# Patient Record
Sex: Male | Born: 1935
Health system: Southern US, Community
[De-identification: ages and names within clinical notes are randomized; demographics above are authoritative.]

## PROBLEM LIST (undated history)

## (undated) DIAGNOSIS — H3530 Unspecified macular degeneration: Secondary | ICD-10-CM

---

## 2010-11-07 ENCOUNTER — Other Ambulatory Visit: Payer: Self-pay | Admitting: Unknown Physician Specialty

## 2010-12-19 ENCOUNTER — Ambulatory Visit: Payer: Self-pay | Admitting: Unknown Physician Specialty

## 2011-01-05 ENCOUNTER — Other Ambulatory Visit: Payer: Self-pay | Admitting: Unknown Physician Specialty

## 2011-04-05 ENCOUNTER — Ambulatory Visit: Payer: Self-pay | Admitting: Ophthalmology

## 2011-04-18 ENCOUNTER — Ambulatory Visit: Payer: Self-pay | Admitting: Ophthalmology

## 2011-06-04 ENCOUNTER — Ambulatory Visit: Payer: Self-pay | Admitting: Ophthalmology

## 2013-07-17 ENCOUNTER — Ambulatory Visit: Payer: Self-pay | Admitting: Internal Medicine

## 2014-09-14 DIAGNOSIS — E785 Hyperlipidemia, unspecified: Secondary | ICD-10-CM | POA: Diagnosis not present

## 2014-09-14 DIAGNOSIS — Z79899 Other long term (current) drug therapy: Secondary | ICD-10-CM | POA: Diagnosis not present

## 2014-09-14 DIAGNOSIS — Z139 Encounter for screening, unspecified: Secondary | ICD-10-CM | POA: Diagnosis not present

## 2014-09-21 DIAGNOSIS — Z87438 Personal history of other diseases of male genital organs: Secondary | ICD-10-CM | POA: Diagnosis not present

## 2014-09-21 DIAGNOSIS — M5136 Other intervertebral disc degeneration, lumbar region: Secondary | ICD-10-CM | POA: Diagnosis not present

## 2014-09-21 DIAGNOSIS — Z79899 Other long term (current) drug therapy: Secondary | ICD-10-CM | POA: Diagnosis not present

## 2014-09-21 DIAGNOSIS — N529 Male erectile dysfunction, unspecified: Secondary | ICD-10-CM | POA: Diagnosis not present

## 2015-04-05 DIAGNOSIS — R05 Cough: Secondary | ICD-10-CM | POA: Diagnosis not present

## 2015-04-05 DIAGNOSIS — Z0000 Encounter for general adult medical examination without abnormal findings: Secondary | ICD-10-CM | POA: Diagnosis not present

## 2015-04-05 DIAGNOSIS — M5136 Other intervertebral disc degeneration, lumbar region: Secondary | ICD-10-CM | POA: Diagnosis not present

## 2015-04-05 DIAGNOSIS — N529 Male erectile dysfunction, unspecified: Secondary | ICD-10-CM | POA: Diagnosis not present

## 2015-05-18 DIAGNOSIS — Z23 Encounter for immunization: Secondary | ICD-10-CM | POA: Diagnosis not present

## 2015-05-18 DIAGNOSIS — S41112S Laceration without foreign body of left upper arm, sequela: Secondary | ICD-10-CM | POA: Diagnosis not present

## 2015-05-26 DIAGNOSIS — L03114 Cellulitis of left upper limb: Secondary | ICD-10-CM | POA: Diagnosis not present

## 2015-05-31 ENCOUNTER — Encounter: Payer: Commercial Managed Care - HMO | Attending: Surgery | Admitting: Surgery

## 2015-05-31 DIAGNOSIS — S51812A Laceration without foreign body of left forearm, initial encounter: Secondary | ICD-10-CM | POA: Insufficient documentation

## 2015-05-31 DIAGNOSIS — X58XXXA Exposure to other specified factors, initial encounter: Secondary | ICD-10-CM | POA: Insufficient documentation

## 2015-05-31 DIAGNOSIS — Z87891 Personal history of nicotine dependence: Secondary | ICD-10-CM | POA: Diagnosis not present

## 2015-05-31 NOTE — Progress Notes (Signed)
[name], [name] ([id number]) Visit Report for 05/31/2015 Allergy List Details Patient Name: [name], [name]. Date of Service: 05/31/2015 1:00 PM Medical Record Number: [id number] Patient Account Number: [id number] Date of Birth/Sex: 02/11/1931 (81 y.o. Male) Treating RN: Baruch Gouty, RN, BSN, Velva Harman Primary Care Physician: Fulton Reek Other Clinician: Referring Physician: Fulton Reek Treating Physician/Extender: Frann Rider in Treatment: 0 Allergies Active Allergies Flexeril Novacaine Allergy Notes Electronic Signature(s) Signed: 05/31/2015 1:13:06 PM By: Regan Lemming BSN, RN Entered By: Regan Lemming on 05/31/2015 13:13:06 Graf, Earney Hamburg ([id number]) -------------------------------------------------------------------------------- Arrival Information Details Patient Name: [name]. Date of Service: 05/31/2015 1:00 PM Medical Record Number: [id number] Patient Account Number: [id number] Date of Birth/Sex: 1931/08/15 (81 y.o. Male) Treating RN: Baruch Gouty, RN, BSN, Velva Harman Primary Care Physician: Fulton Reek Other Clinician: Referring Physician: Fulton Reek Treating Physician/Extender: Frann Rider in Treatment: 0 Visit Information Patient Arrived: Ambulatory Arrival Time: 13:06 Accompanied By: self Transfer Assistance: None Patient Identification Verified: Yes Secondary Verification Process Yes Completed: Patient Requires Transmission-Based No Precautions: Patient Has Alerts: No Electronic Signature(s) Signed: 05/31/2015 1:07:13 PM By: Regan Lemming BSN, RN Entered By: Regan Lemming on 05/31/2015 Harper, Earney Hamburg ([id number]) -------------------------------------------------------------------------------- Clinic Level of Care Assessment Details Patient Name: [name]. Date of Service: 05/31/2015 1:00 PM Medical Record Number: [id number] Patient Account Number: [id number] Date of Birth/Sex: [birth year] (81 y.o. Male) Treating RN: Baruch Gouty, RN, BSN, Velva Harman Primary Care  Physician: Fulton Reek Other Clinician: Referring Physician: Fulton Reek Treating Physician/Extender: Frann Rider in Treatment: 0 Clinic Level of Care Assessment Items TOOL 1 Quantity Score []  - Use when EandM and Procedure is performed on INITIAL visit 0 ASSESSMENTS - Nursing Assessment / Reassessment X - General Physical Exam (combine w/ comprehensive assessment (listed just 1 20 below) when performed on new pt. evals) X - Comprehensive Assessment (HX, ROS, Risk Assessments, Wounds Hx, etc.) 1 25 ASSESSMENTS - Wound and Skin Assessment / Reassessment []  - Dermatologic / Skin Assessment (not related to wound area) 0 ASSESSMENTS - Ostomy and/or Continence Assessment and Care []  - Incontinence Assessment and Management 0 []  - Ostomy Care Assessment and Management (repouching, etc.) 0 PROCESS - Coordination of Care X - Simple Patient / Family Education for ongoing care 1 15 []  - Complex (extensive) Patient / Family Education for ongoing care 0 X - Staff obtains Programmer, systems, Records, Test Results / Process Orders 1 10 []  - Staff telephones HHA, Nursing Homes / Clarify orders / etc 0 []  - Routine Transfer to another Facility (non-emergent condition) 0 []  - Routine Hospital Admission (non-emergent condition) 0 X - New Admissions / Biomedical engineer / Ordering NPWT, Apligraf, etc. 1 15 []  - Emergency Hospital Admission (emergent condition) 0 PROCESS - Special Needs []  - Pediatric / Minor Patient Management 0 []  - Isolation Patient Management 0 Keatley, Landy K. ([id number]) []  - Hearing / Language / Visual special needs 0 []  - Assessment of Community assistance (transportation, D/C planning, etc.) 0 []  - Additional assistance / Altered mentation 0 []  - Support Surface(s) Assessment (bed, cushion, seat, etc.) 0 INTERVENTIONS - Miscellaneous []  - External ear exam 0 []  - Patient Transfer (multiple staff / Civil Service fast streamer / Similar devices) 0 []  - Simple Staple / Suture removal  (25 or less) 0 []  - Complex Staple / Suture removal (26 or more) 0 []  - Hypo/Hyperglycemic Management (do not check if billed separately) 0 []  - Ankle / Brachial Index (ABI) - do not check if billed separately 0 Has the patient been seen at the  hospital within the last three years: Yes Total Score: 85 Level Of Care: New/Established - Level 3 Electronic Signature(s) Signed: 05/31/2015 1:39:25 PM By: Regan Lemming BSN, RN Entered By: Regan Lemming on 05/31/2015 13:39:25 Sansom, Earney Hamburg ([id number]) -------------------------------------------------------------------------------- Encounter Discharge Information Details Patient Name: [name]. Date of Service: 05/31/2015 1:00 PM Medical Record Number: [id number] Patient Account Number: [id number] Date of Birth/Sex: Aug 09, 1931 (81 y.o. Male) Treating RN: Baruch Gouty, RN, BSN, Velva Harman Primary Care Physician: Fulton Reek Other Clinician: Referring Physician: Fulton Reek Treating Physician/Extender: Frann Rider in Treatment: 0 Encounter Discharge Information Items Discharge Pain Level: 0 Discharge Condition: Stable Ambulatory Status: Ambulatory Discharge Destination: Home Transportation: Private Auto Accompanied By: self Schedule Follow-up Appointment: No Medication Reconciliation completed and provided to Patient/Care No Provider: Provided on Clinical Summary of Care: 05/31/2015 Form Type Recipient Paper Patient JB Electronic Signature(s) Signed: 05/31/2015 1:44:56 PM By: Regan Lemming BSN, RN Previous Signature: 05/31/2015 1:44:25 PM Version By: Ruthine Dose Entered By: Regan Lemming on 05/31/2015 13:44:56 Holmer, Earney Hamburg ([id number]) -------------------------------------------------------------------------------- Lower Extremity Assessment Details Patient Name: [name]. Date of Service: 05/31/2015 1:00 PM Medical Record Number: [id number] Patient Account Number: [id number] Date of Birth/Sex: [birth year] (81 y.o. Male) Treating RN:  Baruch Gouty, RN, BSN, Velva Harman Primary Care Physician: Fulton Reek Other Clinician: Referring Physician: Fulton Reek Treating Physician/Extender: Frann Rider in Treatment: 0 Electronic Signature(s) Signed: 05/31/2015 1:13:23 PM By: Regan Lemming BSN, RN Entered By: Regan Lemming on 05/31/2015 13:13:23 Baka, Earney Hamburg ([id number]) -------------------------------------------------------------------------------- Multi Wound Chart Details Patient Name: [name]. Date of Service: 05/31/2015 1:00 PM Medical Record Number: [id number] Patient Account Number: [id number] Date of Birth/Sex: Jan 13, 1931 (81 y.o. Male) Treating RN: Baruch Gouty, RN, BSN, Velva Harman Primary Care Physician: Fulton Reek Other Clinician: Referring Physician: Fulton Reek Treating Physician/Extender: Frann Rider in Treatment: 0 Vital Signs Height(in): 69 Pulse(bpm): 87 Weight(lbs): 180 Blood Pressure 125/75 (mmHg): Body Mass Index(BMI): 27 Temperature(F): 98.3 Respiratory Rate 17 (breaths/min): Photos: [1:No Photos] [N/A:N/A] Wound Location: [1:Left Forearm - Anterior] [N/A:N/A] Wounding Event: [1:Gradually Appeared] [N/A:N/A] Primary Etiology: [1:Skin Tear] [N/A:N/A] Comorbid History: [1:Cataracts] [N/A:N/A] Date Acquired: [1:05/14/2015] [N/A:N/A] Weeks of Treatment: [1:0] [N/A:N/A] Wound Status: [1:Open] [N/A:N/A] Measurements L x W x D 1.7x1.9x0.1 [N/A:N/A] (cm) Area (cm) : [1:2.537] [N/A:N/A] Volume (cm) : [1:0.254] [N/A:N/A] % Reduction in Area: [1:0.00%] [N/A:N/A] % Reduction in Volume: 0.00% [N/A:N/A] Classification: [1:Unclassifiable] [N/A:N/A] Exudate Amount: [1:Small] [N/A:N/A] Exudate Type: [1:Serosanguineous] [N/A:N/A] Exudate Color: [1:red, brown] [N/A:N/A] Wound Margin: [1:Distinct, outline attached] [N/A:N/A] Granulation Amount: [1:None Present (0%)] [N/A:N/A] Necrotic Amount: [1:Large (67-100%)] [N/A:N/A] Necrotic Tissue: [1:Eschar] [N/A:N/A] Exposed Structures: [1:Fascia: No  Fat: No Tendon: No Muscle: No Joint: No Bone: No Limited to Skin Breakdown] [N/A:N/A] Epithelialization: None N/A N/A Periwound Skin Texture: Edema: No N/A N/A Excoriation: No Induration: No Callus: No Crepitus: No Fluctuance: No Friable: No Rash: No Scarring: No Periwound Skin Moist: Yes N/A N/A Moisture: Maceration: No Dry/Scaly: No Periwound Skin Color: Atrophie Blanche: No N/A N/A Cyanosis: No Ecchymosis: No Erythema: No Hemosiderin Staining: No Mottled: No Pallor: No Rubor: No Temperature: No Abnormality N/A N/A Tenderness on No N/A N/A Palpation: Wound Preparation: Ulcer Cleansing: N/A N/A Rinsed/Irrigated with Saline Topical Anesthetic Applied: Other: lidocaine 4 % Treatment Notes Electronic Signature(s) Signed: 05/31/2015 1:21:56 PM By: Regan Lemming BSN, RN Entered By: Regan Lemming on 05/31/2015 13:21:56 Cannell, Earney Hamburg ([id number]) -------------------------------------------------------------------------------- Multi-Disciplinary Care Plan Details Patient Name: [name]. Date of Service: 05/31/2015 1:00 PM Medical Record Number: [id number] Patient Account Number: [id number] Date of Birth/Sex: 1931-09-05 (81 y.o.  Male) Treating RN: Afful, RN, BSN, Velva Harman Primary Care Physician: Fulton Reek Other Clinician: Referring Physician: Fulton Reek Treating Physician/Extender: Frann Rider in Treatment: 0 Active Inactive Orientation to the Wound Care Program Nursing Diagnoses: Knowledge deficit related to the wound healing center program Goals: Patient/caregiver will verbalize understanding of the [facility] Program Date Initiated: 05/31/2015 Goal Status: Active Interventions: Provide education on orientation to the wound center Notes: Wound/Skin Impairment Nursing Diagnoses: Impaired tissue integrity Knowledge deficit related to ulceration/compromised skin integrity Goals: Patient/caregiver will verbalize understanding of skin care  regimen Date Initiated: 05/31/2015 Goal Status: Active Ulcer/skin breakdown will have a volume reduction of 30% by week 4 Date Initiated: 05/31/2015 Goal Status: Active Ulcer/skin breakdown will have a volume reduction of 50% by week 8 Date Initiated: 05/31/2015 Goal Status: Active Ulcer/skin breakdown will have a volume reduction of 80% by week 12 Date Initiated: 05/31/2015 Goal Status: Active Ulcer/skin breakdown will heal within 14 weeks Date Initiated: 05/31/2015 TRUXTON, STUPKA ([id number]) Goal Status: Active Interventions: Assess patient/caregiver ability to obtain necessary supplies Assess patient/caregiver ability to perform ulcer/skin care regimen upon admission and as needed Assess ulceration(s) every visit Provide education on ulcer and skin care Notes: Electronic Signature(s) Signed: 05/31/2015 1:21:43 PM By: Regan Lemming BSN, RN Entered By: Regan Lemming on 05/31/2015 13:21:43 Astorga, Earney Hamburg ([id number]) -------------------------------------------------------------------------------- Pain Assessment Details Patient Name: [name]. Date of Service: 05/31/2015 1:00 PM Medical Record Number: [id number] Patient Account Number: [id number] Date of Birth/Sex: [birth year] (81 y.o. Male) Treating RN: Baruch Gouty, RN, BSN, Velva Harman Primary Care Physician: Fulton Reek Other Clinician: Referring Physician: Fulton Reek Treating Physician/Extender: Frann Rider in Treatment: 0 Active Problems Location of Pain Severity and Description of Pain Patient Has Paino No Site Locations Pain Management and Medication Current Pain Management: Electronic Signature(s) Signed: 05/31/2015 1:07:19 PM By: Regan Lemming BSN, RN Entered By: Regan Lemming on 05/31/2015 13:07:19 Vetsch, Earney Hamburg ([id number]) -------------------------------------------------------------------------------- Patient/Caregiver Education Details Patient Name: [name]. Date of Service: 05/31/2015 1:00 PM Medical Record  Number: [id number] Patient Account Number: [id number] Date of Birth/Gender: 1930/09/19 (81 y.o. Male) Treating RN: Baruch Gouty, RN, BSN, Velva Harman Primary Care Physician: Fulton Reek Other Clinician: Referring Physician: Fulton Reek Treating Physician/Extender: Frann Rider in Treatment: 0 Education Assessment Education Provided To: Patient Education Topics Provided Basic Hygiene: Methods: Explain/Verbal Responses: State content correctly Welcome To [facility]: Methods: Explain/Verbal Responses: State content correctly Wound/Skin Impairment: Methods: Explain/Verbal Responses: State content correctly Electronic Signature(s) Signed: 05/31/2015 1:45:14 PM By: Regan Lemming BSN, RN Entered By: Regan Lemming on 05/31/2015 13:45:14 Zollinger, Earney Hamburg ([id number]) -------------------------------------------------------------------------------- Wound Assessment Details Patient Name: [name]. Date of Service: 05/31/2015 1:00 PM Medical Record Number: [id number] Patient Account Number: [id number] Date of Birth/Sex: November 12, 1930 (81 y.o. Male) Treating RN: Baruch Gouty, RN, BSN, [place] Primary Care Physician: Fulton Reek Other Clinician: Referring Physician: Fulton Reek Treating Physician/Extender: Frann Rider in Treatment: 0 Wound Status Wound Number: 1 Primary Etiology: Skin Tear Wound Location: Left Forearm - Anterior Wound Status: Open Wounding Event: Gradually Appeared Comorbid History: Cataracts Date Acquired: 05/14/2015 Weeks Of Treatment: 0 Clustered Wound: No Photos Photo Uploaded By: Regan Lemming on 05/31/2015 15:50:03 Wound Measurements Length: (cm) 1.7 Width: (cm) 1.9 Depth: (cm) 0.1 Area: (cm) 2.537 Volume: (cm) 0.254 % Reduction in Area: 0% % Reduction in Volume: 0% Epithelialization: None Tunneling: No Undermining: No Wound Description Classification: Unclassifiable Wound Margin: Distinct, outline attached Exudate Amount: Small Exudate  Type: Serosanguineous Exudate Color: red, brown Foul Odor After Cleansing: No  Wound Bed Granulation Amount: None Present (0%) Exposed Structure Necrotic Amount: Large (67-100%) Fascia Exposed: No Necrotic Quality: Eschar Fat Layer Exposed: No Tendon Exposed: No Storer, Kawan K. ([id number]) Muscle Exposed: No Joint Exposed: No Bone Exposed: No Limited to Skin Breakdown Periwound Skin Texture Texture Color No Abnormalities Noted: No No Abnormalities Noted: No Callus: No Atrophie Blanche: No Crepitus: No Cyanosis: No Excoriation: No Ecchymosis: No Fluctuance: No Erythema: No Friable: No Hemosiderin Staining: No Induration: No Mottled: No Localized Edema: No Pallor: No Rash: No Rubor: No Scarring: No Temperature / Pain Moisture Temperature: No Abnormality No Abnormalities Noted: No Dry / Scaly: No Maceration: No Moist: Yes Wound Preparation Ulcer Cleansing: Rinsed/Irrigated with Saline Topical Anesthetic Applied: Other: lidocaine 4 %, Treatment Notes Wound #1 (Left, Anterior Forearm) 1. Cleansed with: Clean wound with Normal Saline 4. Dressing Applied: Hydrafera Blue 5. Secondary Dressing Applied Bordered Foam Dressing Electronic Signature(s) Signed: 05/31/2015 1:16:34 PM By: Regan Lemming BSN, RN Entered By: Regan Lemming on 05/31/2015 13:16:34 Darden, Earney Hamburg ([id number]) -------------------------------------------------------------------------------- Vitals Details Patient Name: [name]. Date of Service: 05/31/2015 1:00 PM Medical Record Number: [id number] Patient Account Number: [id number] Date of Birth/Sex: 1930/10/20 (81 y.o. Male) Treating RN: Afful, RN, BSN, [place] Primary Care Physician: Fulton Reek Other Clinician: Referring Physician: Fulton Reek Treating Physician/Extender: Frann Rider in Treatment: 0 Vital Signs Time Taken: 13:18 Temperature (F): 98.3 Height (in): 69 Pulse (bpm): 87 Source: Stated Respiratory Rate  (breaths/min): 17 Weight (lbs): 180 Blood Pressure (mmHg): 125/75 Source: Stated Reference Range: 80 - 120 mg / dl Body Mass Index (BMI): 26.6 Electronic Signature(s) Signed: 05/31/2015 1:21:06 PM By: Regan Lemming BSN, RN Entered By: Regan Lemming on 05/31/2015 13:21:06

## 2015-05-31 NOTE — Progress Notes (Signed)
SUTTON, HIRSCH ([id number]) Visit Report for 05/31/2015 Abuse/Suicide Risk Screen Details Patient Name: [name], [name]. Date of Service: 05/31/2015 1:00 PM Medical Record Number: [id number] Patient Account Number: [id number] Date of Birth/Sex: Sep 24, 1930 (81 y.o. Male) Treating RN: Baruch Gouty, RN, BSN, Velva Harman Primary Care Physician: Fulton Reek Other Clinician: Referring Physician: Fulton Reek Treating Physician/Extender: Frann Rider in Treatment: 0 Abuse/Suicide Risk Screen Items Answer ABUSE/SUICIDE RISK SCREEN: Has anyone close to you tried to hurt or harm you recentlyo No Do you feel uncomfortable with anyone in your familyo No Has anyone forced you do things that you didnot want to doo No Do you have any thoughts of harming yourselfo No Patient displays signs or symptoms of abuse and/or neglect. No Electronic Signature(s) Signed: 05/31/2015 1:08:50 PM By: Regan Lemming BSN, RN Entered By: Regan Lemming on 05/31/2015 13:08:50 Eyman, Earney Hamburg ([id number]) -------------------------------------------------------------------------------- Activities of Daily Living Details Patient Name: [name], [name]. Date of Service: 05/31/2015 1:00 PM Medical Record Number: [id number] Patient Account Number: [id number] Date of Birth/Sex: [birth year] (81 y.o. Male) Treating RN: Baruch Gouty, RN, BSN, Velva Harman Primary Care Physician: Fulton Reek Other Clinician: Referring Physician: Fulton Reek Treating Physician/Extender: Frann Rider in Treatment: 0 Activities of Daily Living Items Answer Activities of Daily Living (Please select one for each item) Drive Automobile Completely Able Take Medications Completely Able Use Telephone Completely Able Care for Appearance Completely Able Use Toilet Completely Able Bath / Shower Completely Able Dress Self Completely Able Feed Self Completely Able Walk Completely Able Get In / Out Bed Completely Able Housework Completely Able Prepare Meals Completely  [place] for Self Completely Able Electronic Signature(s) Signed: 05/31/2015 1:08:14 PM By: Regan Lemming BSN, RN Entered By: Regan Lemming on 05/31/2015 [place], Earney Hamburg ([id number]) -------------------------------------------------------------------------------- Education Assessment Details Patient Name: [name]. Date of Service: 05/31/2015 1:00 PM Medical Record Number: [id number] Patient Account Number: [id number] Date of Birth/Sex: [birth year] (81 y.o. Male) Treating RN: Baruch Gouty, RN, BSN, Velva Harman Primary Care Physician: Fulton Reek Other Clinician: Referring Physician: Fulton Reek Treating Physician/Extender: Frann Rider in Treatment: 0 Primary Learner Assessed: Patient Learning Preferences/Education Level/Primary Language Learning Preference: Explanation Highest Education Level: College or Above Preferred Language: English Cognitive Barrier Assessment/Beliefs Language Barrier: No Physical Barrier Assessment Impaired Vision: Yes Glasses Impaired Hearing: No Decreased Hand dexterity: No Knowledge/Comprehension Assessment Knowledge Level: High Comprehension Level: High Ability to understand written High instructions: Ability to understand verbal High instructions: Motivation Assessment Anxiety Level: Calm Cooperation: Cooperative Education Importance: Acknowledges Need Interest in Health Problems: Asks Questions Perception: Coherent Willingness to Engage in Self- High Management Activities: Readiness to Engage in Self- High Management Activities: Electronic Signature(s) Signed: 05/31/2015 1:08:42 PM By: Regan Lemming BSN, RN Entered By: Regan Lemming on 05/31/2015 13:08:41 [place], Earney Hamburg ([id number]) -------------------------------------------------------------------------------- Fall Risk Assessment Details Patient Name: [name]. Date of Service: 05/31/2015 1:00 PM Medical Record Number: [id number] Patient Account  Number: [id number] Date of Birth/Sex: 01-26-1931 (81 y.o. Male) Treating RN: Baruch Gouty, RN, BSN, Velva Harman Primary Care Physician: Fulton Reek Other Clinician: Referring Physician: Fulton Reek Treating Physician/Extender: Frann Rider in Treatment: 0 Fall Risk Assessment Items FALL RISK ASSESSMENT: History of falling - immediate or within 3 months 0 No Secondary diagnosis 0 No Ambulatory aid None/bed rest/wheelchair/nurse 0 Yes Crutches/cane/walker 0 No Furniture 0 No IV Access/Saline Lock 0 No Gait/Training Normal/bed rest/immobile 0 Yes Weak 0 No Impaired 0 No Mental Status Oriented to own ability 0 Yes Electronic Signature(s) Signed: 05/31/2015 1:07:53 PM By: Regan Lemming  BSN, RN Entered By: Regan Lemming on 05/31/2015 13:07:53 Tvedt, Earney Hamburg ([id number]) -------------------------------------------------------------------------------- Foot Assessment Details Patient Name: [name], [name]. Date of Service: 05/31/2015 1:00 PM Medical Record Number: [id number] Patient Account Number: [id number] Date of Birth/Sex: 14-Jun-1931 (81 y.o. Male) Treating RN: Baruch Gouty, RN, BSN, Velva Harman Primary Care Physician: Fulton Reek Other Clinician: Referring Physician: Fulton Reek Treating Physician/Extender: Frann Rider in Treatment: 0 Foot Assessment Items Site Locations + = Sensation present, - = Sensation absent, C = Callus, U = Ulcer R = Redness, W = Warmth, M = Maceration, PU = Pre-ulcerative lesion F = Fissure, S = Swelling, D = Dryness Assessment Right: Left: Other Deformity: No No Prior Foot Ulcer: No No Prior Amputation: No No Charcot Joint: No No Ambulatory Status: Ambulatory Without Help Gait: Steady Electronic Signature(s) Signed: 05/31/2015 1:07:34 PM By: Regan Lemming BSN, RN Entered By: Regan Lemming on 05/31/2015 13:07:34 Bunt, Earney Hamburg ([id number]) -------------------------------------------------------------------------------- Nutrition Risk Assessment  Details Patient Name: [name]. Date of Service: 05/31/2015 1:00 PM Medical Record Number: [id number] Patient Account Number: [id number] Date of Birth/Sex: 09-29-1930 (81 y.o. Male) Treating RN: Baruch Gouty, RN, BSN, Velva Harman Primary Care Physician: Fulton Reek Other Clinician: Referring Physician: Fulton Reek Treating Physician/Extender: Frann Rider in Treatment: 0 Height (in): Weight (lbs): Body Mass Index (BMI): Nutrition Risk Assessment Items NUTRITION RISK SCREEN: I have an illness or condition that made me change the kind and/or 0 No amount of food I eat I eat fewer than two meals per day 0 No I eat few fruits and vegetables, or milk products 0 No I have three or more drinks of beer, liquor or wine almost every day 0 No I have tooth or mouth problems that make it hard for me to eat 0 No I don't always have enough money to buy the food I need 0 No I eat alone most of the time 0 No I take three or more different prescribed or over-the-counter drugs a 0 No day Without wanting to, I have lost or gained 10 pounds in the last six 0 No months I am not always physically able to shop, cook and/or feed myself 0 No Nutrition Protocols Good Risk Protocol 0 No interventions needed Moderate Risk Protocol Electronic Signature(s) Signed: 05/31/2015 1:07:41 PM By: Regan Lemming BSN, RN Entered By: Regan Lemming on 05/31/2015 13:07:41

## 2015-06-01 NOTE — Progress Notes (Signed)
TREYTON, SLIMP ([id number]) Visit Report for 05/31/2015 Chief Complaint Document Details Patient Name: [name], [name]. Date of Service: 05/31/2015 1:00 PM Medical Record Number: [id number] Patient Account Number: [id number] Date of Birth/Sex: 05/20/1931 (81 y.o. Male) Treating RN: Baruch Gouty, RN, BSN, Velva Harman Primary Care Physician: Fulton Reek Other Clinician: Referring Physician: Fulton Reek Treating Physician/Extender: Frann Rider in Treatment: 0 Information Obtained from: Patient Chief Complaint Patient presents to the wound care center for a consult due non healing wound. 81 year old gentleman who had a lacerated wound to his left forearm about 17 days ago. Electronic Signature(s) Signed: 05/31/2015 1:45:44 PM By: Christin Fudge MD, FACS Entered By: Christin Fudge on 05/31/2015 13:45:44 Marquina, [name] ([id number]) -------------------------------------------------------------------------------- Debridement Details Patient Name: [name]. Date of Service: 05/31/2015 1:00 PM Medical Record Number: [id number] Patient Account Number: [id number] Date of Birth/Sex: 1931-01-09 (81 y.o. Male) Treating RN: Baruch Gouty, RN, BSN, [place] Primary Care Physician: Fulton Reek Other Clinician: Referring Physician: Fulton Reek Treating Physician/Extender: Frann Rider in Treatment: 0 Debridement Performed for Wound #1 Left,Anterior Forearm Assessment: Performed By: Physician Pat Patrick., MD Debridement: Debridement Pre-procedure No Verification/Time Out Taken: Start Time: 13:35 Pain Control: Lidocaine 4% Topical Solution Level: Skin/Subcutaneous Tissue Total Area Debrided (L x 1.7 (cm) x 1.9 (cm) = 3.23 (cm) W): Tissue and other Viable, Non-Viable, Eschar, Exudate, Fibrin/Slough, Subcutaneous material debrided: Instrument: Forceps Bleeding: Minimum Hemostasis Achieved: Pressure End Time: 13:38 Procedural Pain: 0 Post Procedural Pain: 0 Response to Treatment:  Procedure was tolerated well Post Debridement Measurements of Total Wound Length: (cm) 1.7 Width: (cm) 1.9 Depth: (cm) 0.2 Volume: (cm) 0.507 Post Procedure Diagnosis Same as Pre-procedure Electronic Signature(s) Signed: 05/31/2015 1:45:16 PM By: Christin Fudge MD, FACS Signed: 05/31/2015 3:53:15 PM By: Regan Lemming BSN, RN Previous Signature: 05/31/2015 1:37:42 PM Version By: Regan Lemming BSN, RN Entered By: Christin Fudge on 05/31/2015 13:45:16 [place], [name] ([id number]) -------------------------------------------------------------------------------- HPI Details Patient Name: [name]. Date of Service: 05/31/2015 1:00 PM Medical Record Number: [id number] Patient Account Number: [id number] Date of Birth/Sex: 05-Jun-1931 (81 y.o. Male) Treating RN: Baruch Gouty, RN, BSN, Velva Harman Primary Care Physician: Fulton Reek Other Clinician: Referring Physician: Fulton Reek Treating Physician/Extender: Frann Rider in Treatment: 0 History of Present Illness Location: left forearm laceration Quality: Patient reports No Pain. Severity: Patient states wound are getting worse. Duration: Patient has had the wound for < 3 weeks prior to presenting for treatment Context: The wound occurred when the patient had a laceration at home against a bedpost Modifying Factors: Other treatment(s) tried include:various local medications plus Levaquin Associated Signs and Symptoms: Wound has not had a purlulent drainage HPI Description: spry 81 year old gentleman who had a lacerated wound to his left forearm 17 days ago. He tried various local remedies including anti-biotic ointment, over-the-counter solutions and was recently seen by his PCP who noted cellulitis and put him on Levaquin. His past medical history includes anemia, B12 deficiency, BPH, hemorrhoids. He quit smoking in 1966 Electronic Signature(s) Signed: 05/31/2015 1:48:48 PM By: Christin Fudge MD, FACS Entered By: Christin Fudge on 05/31/2015  13:48:48 [name], [name] ([id number]) -------------------------------------------------------------------------------- Physical Exam Details Patient Name: [name]. Date of Service: 05/31/2015 1:00 PM Medical Record Number: [id number] Patient Account Number: [id number] Date of Birth/Sex: [birth year] (81 y.o. Male) Treating RN: Baruch Gouty, RN, BSN, Velva Harman Primary Care Physician: Fulton Reek Other Clinician: Referring Physician: Fulton Reek Treating Physician/Extender: Frann Rider in Treatment: 0 Constitutional . Pulse regular. Respirations normal and unlabored. Afebrile. . Eyes Nonicteric. Reactive to light. Ears,  Nose, Mouth, and Throat Lips, teeth, and gums WNL.Marland Kitchen Moist mucosa without lesions . Neck supple and nontender. No palpable supraclavicular or cervical adenopathy. Normal sized without goiter. Respiratory WNL. No retractions.. Cardiovascular Pedal Pulses WNL. No clubbing, cyanosis or edema. Gastrointestinal (GI) Abdomen without masses or tenderness.. No liver or spleen enlargement or tenderness.. Lymphatic No adneopathy. No adenopathy. No adenopathy. Musculoskeletal Adexa without tenderness or enlargement.. Digits and nails w/o clubbing, cyanosis, infection, petechiae, ischemia, or inflammatory conditions.. Integumentary (Hair, Skin) No suspicious lesions. No crepitus or fluctuance. No peri-wound warmth or erythema. No masses.Marland Kitchen Psychiatric Judgement and insight Intact.. No evidence of depression, anxiety, or agitation.. Notes The wound on the left forearm was covered by thick eschar and contrast possibly from the dressing material is used. Below this there is a fairly dry wound with slough and discoloration due to the appointments he's been using. Sharp debridement was required to clean this. Electronic Signature(s) Signed: 05/31/2015 1:49:42 PM By: Christin Fudge MD, FACS Entered By: Christin Fudge on 05/31/2015 13:49:42 [name], [name]  ([id number]) -------------------------------------------------------------------------------- Physician Orders Details Patient Name: [name]. Date of Service: 05/31/2015 1:00 PM Medical Record Number: [id number] Patient Account Number: [id number] Date of Birth/Sex: 15-Jul-1931 (81 y.o. Male) Treating RN: Baruch Gouty, RN, BSN, Velva Harman Primary Care Physician: Fulton Reek Other Clinician: Referring Physician: Fulton Reek Treating Physician/Extender: Frann Rider in Treatment: 0 Verbal / Phone Orders: Yes Clinician: Afful, RN, BSN, Rita Read Back and Verified: Yes Diagnosis Coding Wound Cleansing Wound #1 Left,Anterior Forearm o Cleanse wound with mild soap and water Anesthetic Wound #1 Left,Anterior Forearm o Topical Lidocaine 4% cream applied to wound bed prior to debridement Skin Barriers/Peri-Wound Care Wound #1 Left,Anterior Forearm o Skin Prep Primary Wound Dressing Wound #1 Left,Anterior Forearm o Hydrafera Blue Secondary Dressing Wound #1 Left,Anterior Forearm o Boardered Foam Dressing Dressing Change Frequency Wound #1 Left,Anterior Forearm o Change dressing every other day. Follow-up Appointments Wound #1 Left,Anterior Forearm o Return Appointment in 1 week. Electronic Signature(s) Signed: 05/31/2015 1:38:52 PM By: Regan Lemming BSN, RN Signed: 05/31/2015 3:11:54 PM By: Christin Fudge MD, FACS Entered By: Regan Lemming on 05/31/2015 13:38:52 [name], [name] ([id number]) RUE, TINNEL ([id number]) -------------------------------------------------------------------------------- Problem List Details Patient Name: [name], [name]. Date of Service: 05/31/2015 1:00 PM Medical Record Number: [id number] Patient Account Number: [id number] Date of Birth/Sex: [birth year] (81 y.o. Male) Treating RN: Baruch Gouty, RN, BSN, Velva Harman Primary Care Physician: Fulton Reek Other Clinician: Referring Physician: Fulton Reek Treating Physician/Extender: Frann Rider in  Treatment: 0 Active Problems ICD-10 Encounter Code Description Active Date Diagnosis [phone] Laceration without foreign body of left forearm, initial 05/31/2015 Yes encounter Inactive Problems Resolved Problems Electronic Signature(s) Signed: 05/31/2015 1:44:49 PM By: Christin Fudge MD, FACS Entered By: Christin Fudge on 05/31/2015 13:44:49 [name], [name] ([id number]) -------------------------------------------------------------------------------- Progress Note Details Patient Name: [name]. Date of Service: 05/31/2015 1:00 PM Medical Record Number: [id number] Patient Account Number: [id number] Date of Birth/Sex: Apr 16, 1931 (81 y.o. Male) Treating RN: Baruch Gouty, RN, BSN, Velva Harman Primary Care Physician: Fulton Reek Other Clinician: Referring Physician: Fulton Reek Treating Physician/Extender: Frann Rider in Treatment: 0 Subjective Chief Complaint Information obtained from Patient Patient presents to the wound care center for a consult due non healing wound. 81 year old gentleman who had a lacerated wound to his left forearm about 17 days ago. History of Present Illness (HPI) The following HPI elements were documented for the patient's wound: Location: left forearm laceration Quality: Patient reports No Pain. Severity: Patient states wound are getting worse. Duration: Patient has  had the wound for < 3 weeks prior to presenting for treatment Context: The wound occurred when the patient had a laceration at home against a bedpost Modifying Factors: Other treatment(s) tried include:various local medications plus Levaquin Associated Signs and Symptoms: Wound has not had a purlulent drainage spry 81 year old gentleman who had a lacerated wound to his left forearm 17 days ago. He tried various local remedies including anti-biotic ointment, over-the-counter solutions and was recently seen by his PCP who noted cellulitis and put him on Levaquin. His past medical history includes  anemia, B12 deficiency, BPH, hemorrhoids. He quit smoking in 1966 Wound History Patient presents with 1 open wound that has been present for approximately 2weeks. Patient has been treating wound in the following manner: dry. Laboratory tests have not been performed in the last month. Patient reportedly has not tested positive for an antibiotic resistant organism. Patient reportedly has not tested positive for osteomyelitis. Patient reportedly has not had testing performed to evaluate circulation in the legs. Patient History Information obtained from Patient. Allergies Flexeril, Novacaine Family History Cancer - Father, Diabetes - Paternal Grandparents, Maternal Grandparents, No family history of Heart Disease, Hereditary Spherocytosis, Hypertension, Kidney Disease, Lung [name], Eric K. ([id number]) Disease, Seizures, Stroke, Thyroid Problems, Tuberculosis. Social History Former smoker - quit 40yeaers ago, Marital Status - Married, Alcohol Use - Never, Caffeine Use - Daily. Medical History Eyes Patient has history of Cataracts - removed [name]/Nose/Mouth/Throat Denies history of Chronic sinus problems/congestion, Middle [name] problems Hematologic/Lymphatic Denies history of Anemia, Hemophilia, Human Immunodeficiency Virus, Lymphedema, Sickle Cell Disease Respiratory Denies history of Aspiration, Asthma, Chronic Obstructive Pulmonary Disease (COPD), Pneumothorax, Sleep Apnea, Tuberculosis Cardiovascular Denies history of Angina, Arrhythmia, Congestive Heart Failure, Coronary Artery Disease, Deep Vein Thrombosis, Hypertension, Hypotension, Myocardial Infarction, Peripheral Arterial Disease, Peripheral Venous Disease, Phlebitis, Vasculitis Gastrointestinal Denies history of Cirrhosis , Colitis, Crohn s, Hepatitis A, Hepatitis B, Hepatitis C Endocrine Denies history of Type I Diabetes, Type II Diabetes Genitourinary Denies history of End Stage Renal Disease Immunological Denies history of  Lupus Erythematosus, Raynaud s, Scleroderma Integumentary (Skin) Denies history of History of Burn, History of pressure wounds Musculoskeletal Denies history of Gout, Rheumatoid Arthritis, Osteoarthritis, Osteomyelitis Neurologic Denies history of Dementia, Neuropathy, Quadriplegia, Paraplegia, Seizure Disorder Oncologic Denies history of Received Chemotherapy, Received Radiation Medical And Surgical History Notes Eyes Macular degenrrative disease Review of Systems (ROS) Eyes Complains or has symptoms of Glasses / Contacts. [name]/Nose/Mouth/Throat The patient has no complaints or symptoms. Hematologic/Lymphatic The patient has no complaints or symptoms. Respiratory The patient has no complaints or symptoms. Cardiovascular The patient has no complaints or symptoms. [name], [name] ([id number]) Gastrointestinal The patient has no complaints or symptoms. Endocrine The patient has no complaints or symptoms. Genitourinary The patient has no complaints or symptoms. Immunological The patient has no complaints or symptoms. Integumentary (Skin) Complains or has symptoms of Wounds. Musculoskeletal The patient has no complaints or symptoms. Neurologic The patient has no complaints or symptoms. Oncologic The patient has no complaints or symptoms. medication list: he takes calcium carbonate with vitamin D3, vitamin A, Flexeril. Objective Constitutional Pulse regular. Respirations normal and unlabored. Afebrile. Vitals Time Taken: 1:18 PM, Height: 69 in, Source: Stated, Weight: 180 lbs, Source: Stated, BMI: 26.6, Temperature: 98.3 F, Pulse: 87 bpm, Respiratory Rate: 17 breaths/min, Blood Pressure: 125/75 mmHg. Eyes Nonicteric. Reactive to light. Ears, Nose, Mouth, and Throat Lips, teeth, and gums WNL.Marland Kitchen Moist mucosa without lesions . Neck supple and nontender. No palpable supraclavicular or cervical adenopathy. Normal sized without goiter. Respiratory WNL.  No  retractions.. Cardiovascular Sehgal, Kelii K. ([id number]) Pedal Pulses WNL. No clubbing, cyanosis or edema. Gastrointestinal (GI) Abdomen without masses or tenderness.. No liver or spleen enlargement or tenderness.. Lymphatic No adneopathy. No adenopathy. No adenopathy. Musculoskeletal Adexa without tenderness or enlargement.. Digits and nails w/o clubbing, cyanosis, infection, petechiae, ischemia, or inflammatory conditions.Marland Kitchen Psychiatric Judgement and insight Intact.. No evidence of depression, anxiety, or agitation.. General Notes: The wound on the left forearm was covered by thick eschar and contrast possibly from the dressing material is used. Below this there is a fairly dry wound with slough and discoloration due to the appointments he's been using. Sharp debridement was required to clean this. Integumentary (Hair, Skin) No suspicious lesions. No crepitus or fluctuance. No peri-wound warmth or erythema. No masses.. Wound #1 status is Open. Original cause of wound was Gradually Appeared. The wound is located on the Left,Anterior Forearm. The wound measures 1.7cm length x 1.9cm width x 0.1cm depth; 2.537cm^2 area and 0.254cm^3 volume. The wound is limited to skin breakdown. There is no tunneling or undermining noted. There is a small amount of serosanguineous drainage noted. The wound margin is distinct with the outline attached to the wound base. There is no granulation within the wound bed. There is a large (67- 100%) amount of necrotic tissue within the wound bed including Eschar. The periwound skin appearance exhibited: Moist. The periwound skin appearance did not exhibit: Callus, Crepitus, Excoriation, Fluctuance, Friable, Induration, Localized Edema, Rash, Scarring, Dry/Scaly, Maceration, Atrophie Blanche, Cyanosis, Ecchymosis, Hemosiderin Staining, Mottled, Pallor, Rubor, Erythema. Periwound temperature was noted as No Abnormality. Assessment Active Problems ICD-10 Z32.992E -  Laceration without foreign body of left forearm, initial encounter Fonder, Ondra K. ([id number]) This lacerated wound of the left forearm has been treated with several abrasive chemicals and now has fairly dry wound base. After debriding and appropriately I have recommended Hydroferra blue and will use this on alternate days with a bordered foam dressing. Details of wound care have been discussed with him and he will come back and see as next week. Procedures Wound #1 Wound #1 is a Skin Tear located on the Left,Anterior Forearm . There was a Skin/Subcutaneous Tissue Debridement ([id number]) debridement with total area of 3.23 sq cm performed by Rini Moffit, Jackson Latino., MD. with the following instrument(s): Forceps to remove Viable and Non-Viable tissue/material including Exudate, Fibrin/Slough, Eschar, and Subcutaneous after achieving pain control using Lidocaine 4% Topical Solution. A time out was not conducted prior to the start of the procedure. A Minimum amount of bleeding was controlled with Pressure. The procedure was tolerated well with a pain level of 0 throughout and a pain level of 0 following the procedure. Post Debridement Measurements: 1.7cm length x 1.9cm width x 0.2cm depth; 0.507cm^3 volume. Post procedure Diagnosis Wound #1: Same as Pre-Procedure Plan Wound Cleansing: Wound #1 Left,Anterior Forearm: Cleanse wound with mild soap and water Anesthetic: Wound #1 Left,Anterior Forearm: Topical Lidocaine 4% cream applied to wound bed prior to debridement Skin Barriers/Peri-Wound Care: Wound #1 Left,Anterior Forearm: Skin Prep Primary Wound Dressing: Wound #1 Left,Anterior Forearm: Hydrafera Blue Secondary Dressing: Wound #1 Left,Anterior Forearm: Boardered Foam Dressing Dressing Change Frequency: Wound #1 Left,Anterior Forearm: Change dressing every other day. Follow-up Appointments: Wound #1 Left,Anterior Forearm: Return Appointment in 1 week. VASILIS, LUHMAN. ([id number]) This  lacerated wound of the left forearm has been treated with several abrasive chemicals and now has fairly dry wound base. After debriding and appropriately I have recommended Hydroferra blue and will use this on alternate days with  a bordered foam dressing. Details of wound care have been discussed with him and he will come back and see as next week. Electronic Signature(s) Signed: 05/31/2015 1:52:14 PM By: Christin Fudge MD, FACS Entered By: Christin Fudge on 05/31/2015 13:52:14 Peron, [name] ([id number]) -------------------------------------------------------------------------------- ROS/PFSH Details Patient Name: [name]. Date of Service: 05/31/2015 1:00 PM Medical Record Number: [id number] Patient Account Number: [id number] Date of Birth/Sex: [birth year] (81 y.o. Male) Treating RN: Baruch Gouty, RN, BSN, [place] Primary Care Physician: Fulton Reek Other Clinician: Referring Physician: Fulton Reek Treating Physician/Extender: Frann Rider in Treatment: 0 Information Obtained From Patient Wound History Do you currently have one or more open woundso Yes How many open wounds do you currently haveo 1 Approximately how long have you had your woundso 2weeks How have you been treating your wound(s) until nowo dry Has your wound(s) ever healed and then re-openedo No Have you had any lab work done in the past montho No Have you tested positive for an antibiotic resistant organism (MRSA, VRE)o No Have you tested positive for osteomyelitis (bone infection)o No Have you had any tests for circulation on your legso No Eyes Complaints and Symptoms: Positive for: Glasses / Contacts Medical History: Positive for: Cataracts - removed Past Medical History Notes: Macular degenrrative disease Integumentary (Skin) Complaints and Symptoms: Positive for: Wounds Medical History: Negative for: History of Burn; History of pressure wounds [name]/Nose/Mouth/Throat Complaints and Symptoms: No  Complaints or Symptoms Medical History: Negative for: Chronic sinus problems/congestion; Middle [name] problems Hematologic/Lymphatic [name], CLERK. ([id number]) Complaints and Symptoms: No Complaints or Symptoms Medical History: Negative for: Anemia; Hemophilia; Human Immunodeficiency Virus; Lymphedema; Sickle Cell Disease Respiratory Complaints and Symptoms: No Complaints or Symptoms Medical History: Negative for: Aspiration; Asthma; Chronic Obstructive Pulmonary Disease (COPD); Pneumothorax; Sleep Apnea; Tuberculosis Cardiovascular Complaints and Symptoms: No Complaints or Symptoms Medical History: Negative for: Angina; Arrhythmia; Congestive Heart Failure; Coronary Artery Disease; Deep Vein Thrombosis; Hypertension; Hypotension; Myocardial Infarction; Peripheral Arterial Disease; Peripheral Venous Disease; Phlebitis; Vasculitis Gastrointestinal Complaints and Symptoms: No Complaints or Symptoms Medical History: Negative for: Cirrhosis ; Colitis; Crohnos; Hepatitis A; Hepatitis B; Hepatitis C Endocrine Complaints and Symptoms: No Complaints or Symptoms Medical History: Negative for: Type I Diabetes; Type II Diabetes Genitourinary Complaints and Symptoms: No Complaints or Symptoms Medical History: Negative for: End Stage Renal Disease Immunological [name], Eric K. ([id number]) Complaints and Symptoms: No Complaints or Symptoms Medical History: Negative for: Lupus Erythematosus; Raynaudos; Scleroderma Musculoskeletal Complaints and Symptoms: No Complaints or Symptoms Medical History: Negative for: Gout; Rheumatoid Arthritis; Osteoarthritis; Osteomyelitis Neurologic Complaints and Symptoms: No Complaints or Symptoms Medical History: Negative for: Dementia; Neuropathy; Quadriplegia; Paraplegia; Seizure Disorder Oncologic Complaints and Symptoms: No Complaints or Symptoms Medical History: Negative for: Received Chemotherapy; Received Radiation HBO Extended History  Items Eyes: Cataracts Family and Social History Cancer: Yes - Father; Diabetes: Yes - Paternal Grandparents, Maternal Grandparents; Heart Disease: No; Hereditary Spherocytosis: No; Hypertension: No; Kidney Disease: No; Lung Disease: No; Seizures: No; Stroke: No; Thyroid Problems: No; Tuberculosis: No; Former smoker - quit 40yeaers ago; Marital Status - Married; Alcohol Use: Never; Caffeine Use: Daily; Financial Concerns: No; Food, Clothing or Shelter Needs: No; Support System Lacking: No; Transportation Concerns: No; Advanced Directives: No; Patient does not want information on Advanced Directives; Living Will: No Physician Affirmation I have reviewed and agree with the above information. Electronic Signature(s) Signed: 05/31/2015 1:49:59 PM By: Christin Fudge MD, FACS Signed: 05/31/2015 3:53:15 PM By: Regan Lemming BSN, RN Previous Signature: 05/31/2015 1:12:24 PM Version By: Regan Lemming BSN, RN  Previous Signature: 05/31/2015 1:13:39 PM Version By: Christin Fudge MD, FACS [name], [name] ([id number]) Entered By: Christin Fudge on 05/31/2015 13:49:59 [name], [name] ([id number]) -------------------------------------------------------------------------------- SuperBill Details Patient Name: [name]. Date of Service: 05/31/2015 Medical Record Number: [id number] Patient Account Number: [id number] Date of Birth/Sex: January 06, 1931 (81 y.o. Male) Treating RN: Baruch Gouty, RN, BSN, Velva Harman Primary Care Physician: Fulton Reek Other Clinician: Referring Physician: Fulton Reek Treating Physician/Extender: Frann Rider in Treatment: 0 Diagnosis Coding ICD-10 Codes Code Description [phone] Laceration without foreign body of left forearm, initial encounter Facility Procedures CPT4 Code Description: [id number] [place] VISIT-LEV 3 EST PT Modifier: Quantity: 1 CPT4 Code Description: [id number] 15041 - DEB SUBQ TISSUE 20 SQ CM/< ICD-10 Description Diagnosis S51.812A Laceration without foreign  body of left forearm, init Modifier: ial encounte Quantity: 1 r Physician Procedures CPT4 Code Description: [id number] 79396 - WC PHYS LEVEL 4 - NEW PT ICD-10 Description Diagnosis S51.812A Laceration without foreign body of left forearm, init Modifier: ial encounter Quantity: 1 CPT4 Code Description: [id number] 11042 - WC PHYS SUBQ TISS 20 SQ CM ICD-10 Description Diagnosis S51.812A Laceration without foreign body of left forearm, init Modifier: ial encounter Quantity: 1 Electronic Signature(s) Signed: 05/31/2015 1:52:42 PM By: Christin Fudge MD, FACS Entered By: Christin Fudge on 05/31/2015 13:52:42

## 2015-06-07 ENCOUNTER — Encounter: Payer: Commercial Managed Care - HMO | Admitting: Surgery

## 2015-06-07 DIAGNOSIS — Z87891 Personal history of nicotine dependence: Secondary | ICD-10-CM | POA: Diagnosis not present

## 2015-06-07 DIAGNOSIS — S51812A Laceration without foreign body of left forearm, initial encounter: Secondary | ICD-10-CM | POA: Diagnosis not present

## 2015-06-08 NOTE — Progress Notes (Signed)
[name] ([id number]) Visit Report for 06/07/2015 Arrival Information Details Patient Name: [name], [name]. Date of Service: 06/07/2015 2:15 PM Medical Record Number: [id number] Patient Account Number: [id number] Date of Birth/Sex: 07-04-1931 (81 y.o. Male) Treating RN: Cornell Barman Primary Care Physician: Fulton Reek Other Clinician: Referring Physician: Fulton Reek Treating Physician/Extender: Frann Rider in Treatment: 1 Visit Information History Since Last Visit Added or deleted any medications: No Patient Arrived: Ambulatory Any new allergies or adverse reactions: No Arrival Time: 14:31 Had a fall or experienced change in No Accompanied By: self activities of daily living that may affect Transfer Assistance: None risk of falls: Patient Identification Verified: Yes Signs or symptoms of abuse/neglect since last No Secondary Verification Process Yes visito Completed: Hospitalized since last visit: No Patient Requires Transmission-Based No Has Dressing in Place as Prescribed: Yes Precautions: Pain Present Now: No Patient Has Alerts: No Electronic Signature(s) Signed: 06/07/2015 5:26:34 PM By: Gretta Cool, RN, BSN, Kim RN, BSN Entered By: Gretta Cool, RN, BSN, Kim on 06/07/2015 14:32:12 [place], Earney Hamburg ([id number]) -------------------------------------------------------------------------------- Encounter Discharge Information Details Patient Name: [name]. Date of Service: 06/07/2015 2:15 PM Medical Record Number: [id number] Patient Account Number: [id number] Date of Birth/Sex: 1931-02-02 (81 y.o. Male) Treating RN: Montey Hora Primary Care Physician: Fulton Reek Other Clinician: Referring Physician: Fulton Reek Treating Physician/Extender: Frann Rider in Treatment: 1 Encounter Discharge Information Items Discharge Pain Level: 0 Discharge Condition: Stable Ambulatory Status: Ambulatory Discharge Destination: Home Transportation: Private  Auto Accompanied By: self Schedule Follow-up Appointment: Yes Medication Reconciliation completed and provided to Patient/Care Yes Provider: Provided on Clinical Summary of Care: 06/07/2015 Form Type Recipient Paper Patient JB Electronic Signature(s) Signed: 06/07/2015 5:26:34 PM By: Gretta Cool RN, BSN, Kim RN, BSN Previous Signature: 06/07/2015 2:44:52 PM Version By: Ruthine Dose Entered By: Gretta Cool RN, BSN, Kim on 06/07/2015 14:47:11 [place], Earney Hamburg ([id number]) -------------------------------------------------------------------------------- Multi Wound Chart Details Patient Name: [name]. Date of Service: 06/07/2015 2:15 PM Medical Record Number: [id number] Patient Account Number: [id number] Date of Birth/Sex: 03-06-1931 (81 y.o. Male) Treating RN: Cornell Barman Primary Care Physician: Fulton Reek Other Clinician: Referring Physician: Fulton Reek Treating Physician/Extender: Frann Rider in Treatment: 1 Vital Signs Height(in): 69 Pulse(bpm): 80 Weight(lbs): 180 Blood Pressure 126/79 (mmHg): Body Mass Index(BMI): 27 Temperature(F): 98.3 Respiratory Rate 18 (breaths/min): Photos: [1:No Photos] [N/A:N/A] Wound Location: [1:Left Forearm - Anterior] [N/A:N/A] Wounding Event: [1:Gradually Appeared] [N/A:N/A] Primary Etiology: [1:Skin Tear] [N/A:N/A] Comorbid History: [1:Cataracts] [N/A:N/A] Date Acquired: [1:05/14/2015] [N/A:N/A] Weeks of Treatment: [1:1] [N/A:N/A] Wound Status: [1:Open] [N/A:N/A] Measurements L x W x D 1.4x1.2x0.1 [N/A:N/A] (cm) Area (cm) : [1:1.319] [N/A:N/A] Volume (cm) : [1:0.132] [N/A:N/A] % Reduction in Area: [1:48.00%] [N/A:N/A] % Reduction in Volume: 48.00% [N/A:N/A] Classification: [1:Full Thickness Without Exposed Support Structures] [N/A:N/A] Exudate Amount: [1:Small] [N/A:N/A] Exudate Type: [1:Serosanguineous] [N/A:N/A] Exudate Color: [1:red, brown] [N/A:N/A] Wound Margin: [1:Distinct, outline attached] [N/A:N/A] Granulation  Amount: [1:Small (1-33%)] [N/A:N/A] Granulation Quality: [1:Pink] [N/A:N/A] Necrotic Amount: [1:Large (67-100%)] [N/A:N/A] Necrotic Tissue: [1:Eschar] [N/A:N/A] Exposed Structures: [1:Fascia: No Fat: No Tendon: No Muscle: No Joint: No] [N/A:N/A] Bone: No Limited to Skin Breakdown Epithelialization: None N/A N/A Periwound Skin Texture: Scarring: Yes N/A N/A Edema: No Excoriation: No Induration: No Callus: No Crepitus: No Fluctuance: No Friable: No Rash: No Periwound Skin Moist: Yes N/A N/A Moisture: Maceration: No Dry/Scaly: No Periwound Skin Color: Atrophie Blanche: No N/A N/A Cyanosis: No Ecchymosis: No Erythema: No Hemosiderin Staining: No Mottled: No Pallor: No Rubor: No Temperature: No Abnormality N/A N/A Tenderness on No N/A N/A Palpation: Wound  Preparation: Ulcer Cleansing: N/A N/A Rinsed/Irrigated with Saline Topical Anesthetic Applied: Other: lidocaine 4 % Treatment Notes Electronic Signature(s) Signed: 06/07/2015 5:26:34 PM By: Gretta Cool, RN, BSN, Kim RN, BSN Entered By: Gretta Cool, RN, BSN, Kim on 06/07/2015 14:36:44 [place], Earney Hamburg ([id number]) -------------------------------------------------------------------------------- Multi-Disciplinary Care Plan Details Patient Name: [name]. Date of Service: 06/07/2015 2:15 PM Medical Record Number: [id number] Patient Account Number: [id number] Date of Birth/Sex: 05-Sep-1931 (81 y.o. Male) Treating RN: Cornell Barman Primary Care Physician: Fulton Reek Other Clinician: Referring Physician: Fulton Reek Treating Physician/Extender: Frann Rider in Treatment: 1 Active Inactive Orientation to the Wound Care Program Nursing Diagnoses: Knowledge deficit related to the wound healing center program Goals: Patient/caregiver will verbalize understanding of the [facility] Date Initiated: 05/31/2015 Goal Status: Active Interventions: Provide education on orientation to the wound  center Notes: Wound/Skin Impairment Nursing Diagnoses: Impaired tissue integrity Knowledge deficit related to ulceration/compromised skin integrity Goals: Patient/caregiver will verbalize understanding of skin care regimen Date Initiated: 05/31/2015 Goal Status: Active Ulcer/skin breakdown will have a volume reduction of 30% by week 4 Date Initiated: 05/31/2015 Goal Status: Active Ulcer/skin breakdown will have a volume reduction of 50% by week 8 Date Initiated: 05/31/2015 Goal Status: Active Ulcer/skin breakdown will have a volume reduction of 80% by week 12 Date Initiated: 05/31/2015 Goal Status: Active Ulcer/skin breakdown will heal within 14 weeks Date Initiated: 05/31/2015 LOTUS, GOVER ([id number]) Goal Status: Active Interventions: Assess patient/caregiver ability to obtain necessary supplies Assess patient/caregiver ability to perform ulcer/skin care regimen upon admission and as needed Assess ulceration(s) every visit Provide education on ulcer and skin care Notes: Electronic Signature(s) Signed: 06/07/2015 5:26:34 PM By: Gretta Cool, RN, BSN, Kim RN, BSN Entered By: Gretta Cool, RN, BSN, Kim on 06/07/2015 14:36:36 [place], Earney Hamburg ([id number]) -------------------------------------------------------------------------------- Patient/Caregiver Education Details Patient Name: [name]. Date of Service: 06/07/2015 2:15 PM Medical Record Number: [id number] Patient Account Number: [id number] Date of Birth/Gender: 16-Mar-1931 (81 y.o. Male) Treating RN: Cornell Barman Primary Care Physician: Fulton Reek Other Clinician: Referring Physician: Fulton Reek Treating Physician/Extender: Frann Rider in Treatment: 1 Education Assessment Education Provided To: Patient Education Topics Provided Wound/Skin Impairment: Handouts: Caring for Your Ulcer, Other: continue wound care as presscribed Electronic Signature(s) Signed: 06/07/2015 5:26:34 PM By: Gretta Cool, RN, BSN, Kim RN, BSN Entered  By: Gretta Cool, RN, BSN, Kim on 06/07/2015 14:47:38 [place], Earney Hamburg ([id number]) -------------------------------------------------------------------------------- Wound Assessment Details Patient Name: [name]. Date of Service: 06/07/2015 2:15 PM Medical Record Number: [id number] Patient Account Number: [id number] Date of Birth/Sex: 10-Jun-1931 (81 y.o. Male) Treating RN: Cornell Barman Primary Care Physician: Fulton Reek Other Clinician: Referring Physician: Fulton Reek Treating Physician/Extender: Frann Rider in Treatment: 1 Wound Status Wound Number: 1 Primary Etiology: Skin Tear Wound Location: Left Forearm - Anterior Wound Status: Open Wounding Event: Gradually Appeared Comorbid History: Cataracts Date Acquired: 05/14/2015 Weeks Of Treatment: 1 Clustered Wound: No Photos Photo Uploaded By: Gretta Cool, RN, BSN, Kim on 06/07/2015 17:01:59 Wound Measurements Length: (cm) 1.4 Width: (cm) 1.2 Depth: (cm) 0.1 Area: (cm) 1.319 Volume: (cm) 0.132 % Reduction in Area: 48% % Reduction in Volume: 48% Epithelialization: None Tunneling: No Undermining: No Wound Description Full Thickness Without Exposed Classification: Support Structures Wound Margin: Distinct, outline attached Exudate Small Amount: Exudate Type: Serosanguineous Exudate Color: red, brown Foul Odor After Cleansing: No Wound Bed Granulation Amount: Small (1-33%) Exposed Structure Granulation Quality: Pink Fascia Exposed: No Necrotic Amount: Large (67-100%) Fat Layer Exposed: No Bukhari, Ammiel K. ([id number]) Necrotic Quality: Eschar Tendon Exposed: No Muscle Exposed:  No Joint Exposed: No Bone Exposed: No Limited to Skin Breakdown Periwound Skin Texture Texture Color No Abnormalities Noted: No No Abnormalities Noted: No Callus: No Atrophie Blanche: No Crepitus: No Cyanosis: No Excoriation: No Ecchymosis: No Fluctuance: No Erythema: No Friable: No Hemosiderin Staining: No Induration:  No Mottled: No Localized Edema: No Pallor: No Rash: No Rubor: No Scarring: Yes Temperature / Pain Moisture Temperature: No Abnormality No Abnormalities Noted: No Dry / Scaly: No Maceration: No Moist: Yes Wound Preparation Ulcer Cleansing: Rinsed/Irrigated with Saline Topical Anesthetic Applied: Other: lidocaine 4 %, Treatment Notes Wound #1 (Left, Anterior Forearm) 1. Cleansed with: Clean wound with Normal Saline 2. Anesthetic Topical Lidocaine 4% cream to wound bed prior to debridement 4. Dressing Applied: Hydrafera Blue 5. Secondary Dressing Applied Bordered Foam Dressing Electronic Signature(s) Signed: 06/07/2015 5:26:34 PM By: Gretta Cool, RN, BSN, Kim RN, BSN Entered By: Gretta Cool, RN, BSN, Kim on 06/07/2015 14:34:52 [place], Earney Hamburg ([id number]) -------------------------------------------------------------------------------- Vitals Details Patient Name: [name]. Date of Service: 06/07/2015 2:15 PM Medical Record Number: [id number] Patient Account Number: [id number] Date of Birth/Sex: 1931/01/19 (81 y.o. Male) Treating RN: Cornell Barman Primary Care Physician: Fulton Reek Other Clinician: Referring Physician: Fulton Reek Treating Physician/Extender: Frann Rider in Treatment: 1 Vital Signs Time Taken: 14:32 Temperature (F): 98.3 Height (in): 69 Pulse (bpm): 80 Weight (lbs): 180 Respiratory Rate (breaths/min): 18 Body Mass Index (BMI): 26.6 Blood Pressure (mmHg): 126/79 Reference Range: 80 - 120 mg / dl Electronic Signature(s) Signed: 06/07/2015 5:26:34 PM By: Gretta Cool, RN, BSN, Kim RN, BSN Entered By: Gretta Cool, RN, BSN, Kim on 06/07/2015 14:33:26

## 2015-06-08 NOTE — Progress Notes (Signed)
Eric, [name] ([id number]) Visit Report for 06/07/2015 Chief Complaint Document Details Patient Name: [name], [name]. Date of Service: 06/07/2015 2:15 PM Medical Record Number: [id number] Patient Account Number: [id number] Date of Birth/Sex: 12/11/1930 (81 y.o. Male) Treating RN: Montey Hora Primary Care Physician: Fulton Reek Other Clinician: Referring Physician: Fulton Reek Treating Physician/Extender: Frann Rider in Treatment: 1 Information Obtained from: Patient Chief Complaint Patient presents to the wound care center for a consult due non healing wound. 81 year old gentleman who had a lacerated wound to his left forearm about 17 days ago. Electronic Signature(s) Signed: 06/07/2015 2:51:12 PM By: Christin Fudge MD, FACS Entered By: Christin Fudge on 06/07/2015 Payne, Earney Hamburg ([id number]) -------------------------------------------------------------------------------- Debridement Details Patient Name: [name]. Date of Service: 06/07/2015 2:15 PM Medical Record Number: [id number] Patient Account Number: [id number] Date of Birth/Sex: 1931-05-13 (81 y.o. Male) Treating RN: Montey Hora Primary Care Physician: Fulton Reek Other Clinician: Referring Physician: Fulton Reek Treating Physician/Extender: Frann Rider in Treatment: 1 Debridement Performed for Wound #1 Left,Anterior Forearm Assessment: Performed By: Physician Pat Patrick., MD Debridement: Debridement Pre-procedure Yes Verification/Time Out Taken: Start Time: 14:40 Pain Control: Other : lidocaine 4% Level: Skin/Subcutaneous Tissue Total Area Debrided (L x 1.4 (cm) x 1.2 (cm) = 1.68 (cm) W): Tissue and other Viable, Non-Viable, Exudate, Fibrin/Slough, Subcutaneous material debrided: Instrument: Curette Bleeding: Minimum Hemostasis Achieved: Pressure End Time: 14:45 Procedural Pain: 0 Post Procedural Pain: 0 Response to Treatment: Procedure was tolerated well Post  Debridement Measurements of Total Wound Length: (cm) 1.4 Width: (cm) 1.2 Depth: (cm) 0.1 Volume: (cm) 0.132 Post Procedure Diagnosis Same as Pre-procedure Electronic Signature(s) Signed: 06/07/2015 2:51:05 PM By: Christin Fudge MD, FACS Signed: 06/07/2015 4:21:27 PM By: Montey Hora Entered By: Christin Fudge on 06/07/2015 14:51:05 Bendix, Earney Hamburg ([id number]) -------------------------------------------------------------------------------- HPI Details Patient Name: [name]. Date of Service: 06/07/2015 2:15 PM Medical Record Number: [id number] Patient Account Number: [id number] Date of Birth/Sex: 04-Dec-1930 (80 y.o. Male) Treating RN: Montey Hora Primary Care Physician: Fulton Reek Other Clinician: Referring Physician: Fulton Reek Treating Physician/Extender: Frann Rider in Treatment: 1 History of Present Illness Location: left forearm laceration Quality: Patient reports No Pain. Severity: Patient states wound are getting worse. Duration: Patient has had the wound for < 3 weeks prior to presenting for treatment Context: The wound occurred when the patient had a laceration at home against a bedpost Modifying Factors: Other treatment(s) tried include:various local medications plus Levaquin Associated Signs and Symptoms: Wound has not had a purlulent drainage HPI Description: spry 81 year old gentleman who had a lacerated wound to his left forearm 17 days ago. He tried various local remedies including anti-biotic ointment, over-the-counter solutions and was recently seen by his PCP who noted cellulitis and put him on Levaquin. His past medical history includes anemia, B12 deficiency, BPH, hemorrhoids. He quit smoking in 1966 Electronic Signature(s) Signed: 06/07/2015 2:51:17 PM By: Christin Fudge MD, FACS Entered By: Christin Fudge on 06/07/2015 14:51:17 Miler, Earney Hamburg  ([id number]) -------------------------------------------------------------------------------- Physical Exam Details Patient Name: [name]. Date of Service: 06/07/2015 2:15 PM Medical Record Number: [id number] Patient Account Number: [id number] Date of Birth/Sex: Mar 16, 1931 (81 y.o. Male) Treating RN: Montey Hora Primary Care Physician: Fulton Reek Other Clinician: Referring Physician: Fulton Reek Treating Physician/Extender: Frann Rider in Treatment: 1 Constitutional . Pulse regular. Respirations normal and unlabored. Afebrile. . Eyes Nonicteric. Reactive to light. Ears, Nose, Mouth, and Throat Lips, teeth, and gums WNL.Marland Kitchen Moist mucosa without lesions . Neck supple and nontender. No palpable supraclavicular or  cervical adenopathy. Normal sized without goiter. Respiratory WNL. No retractions.. Cardiovascular Pedal Pulses WNL. No clubbing, cyanosis or edema. Chest Breasts symmetical and no nipple discharge.. Breast tissue WNL, no masses, lumps, or tenderness.. Lymphatic No adneopathy. No adenopathy. No adenopathy. Musculoskeletal Adexa without tenderness or enlargement.. Digits and nails w/o clubbing, cyanosis, infection, petechiae, ischemia, or inflammatory conditions.. Integumentary (Hair, Skin) No suspicious lesions. No crepitus or fluctuance. No peri-wound warmth or erythema. No masses.Marland Kitchen Psychiatric Judgement and insight Intact.. No evidence of depression, anxiety, or agitation.. Notes He still has some amount of eschar though it is looking much better than last week. I have used a curetted sharply debrided down to healthy subcutaneous tissue. Electronic Signature(s) Signed: 06/07/2015 2:52:23 PM By: Christin Fudge MD, FACS Entered By: Christin Fudge on 06/07/2015 14:52:22 Soderberg, Earney Hamburg ([id number]) -------------------------------------------------------------------------------- Physician Orders Details Patient Name: [name]. Date of Service:  06/07/2015 2:15 PM Medical Record Number: [id number] Patient Account Number: [id number] Date of Birth/Sex: [birth year] (81 y.o. Male) Treating RN: Cornell Barman Primary Care Physician: Fulton Reek Other Clinician: Referring Physician: Fulton Reek Treating Physician/Extender: Frann Rider in Treatment: 1 Verbal / Phone Orders: Yes Clinician: Cornell Barman Read Back and Verified: Yes Diagnosis Coding Wound Cleansing Wound #1 Left,Anterior Forearm o Cleanse wound with mild soap and water Anesthetic Wound #1 Left,Anterior Forearm o Topical Lidocaine 4% cream applied to wound bed prior to debridement Skin Barriers/Peri-Wound Care Wound #1 Left,Anterior Forearm o Skin Prep Primary Wound Dressing Wound #1 Left,Anterior Forearm o Hydrafera Blue Secondary Dressing Wound #1 Left,Anterior Forearm o Boardered Foam Dressing Dressing Change Frequency Wound #1 Left,Anterior Forearm o Change dressing every other day. Follow-up Appointments Wound #1 Left,Anterior Forearm o Return Appointment in 1 week. Electronic Signature(s) Signed: 06/07/2015 3:53:46 PM By: Christin Fudge MD, FACS Signed: 06/07/2015 5:26:34 PM By: Gretta Cool RN, BSN, Kim RN, BSN Entered By: Gretta Cool, RN, BSN, Kim on 06/07/2015 14:46:23 Proehl, GARRIT MARROW ([id number], LACROIX ([id number]) -------------------------------------------------------------------------------- Problem List Details Patient Name: BENJAMINE, STROUT. Date of Service: 06/07/2015 2:15 PM Medical Record Number: [id number] Patient Account Number: [id number] Date of Birth/Sex: May 02, 1931 (81 y.o. Male) Treating RN: Montey Hora Primary Care Physician: Fulton Reek Other Clinician: Referring Physician: Fulton Reek Treating Physician/Extender: Frann Rider in Treatment: 1 Active Problems ICD-10 Encounter Code Description Active Date Diagnosis [phone] Laceration without foreign body of left forearm, initial 05/31/2015  Yes encounter Inactive Problems Resolved Problems Electronic Signature(s) Signed: 06/07/2015 2:50:56 PM By: Christin Fudge MD, FACS Entered By: Christin Fudge on 06/07/2015 14:50:55 Spargo, Earney Hamburg ([id number]) -------------------------------------------------------------------------------- Progress Note Details Patient Name: [name]. Date of Service: 06/07/2015 2:15 PM Medical Record Number: [id number] Patient Account Number: [id number] Date of Birth/Sex: [birth year] (81 y.o. Male) Treating RN: Montey Hora Primary Care Physician: Fulton Reek Other Clinician: Referring Physician: Fulton Reek Treating Physician/Extender: Frann Rider in Treatment: 1 Subjective Chief Complaint Information obtained from Patient Patient presents to the wound care center for a consult due non healing wound. 81 year old gentleman who had a lacerated wound to his left forearm about 17 days ago. History of Present Illness (HPI) The following HPI elements were documented for the patient's wound: Location: left forearm laceration Quality: Patient reports No Pain. Severity: Patient states wound are getting worse. Duration: Patient has had the wound for < 3 weeks prior to presenting for treatment Context: The wound occurred when the patient had a laceration at home against a bedpost Modifying Factors: Other treatment(s) tried include:various local medications plus Levaquin Associated Signs and Symptoms: Wound has  not had a purlulent drainage spry 81 year old gentleman who had a lacerated wound to his left forearm 17 days ago. He tried various local remedies including anti-biotic ointment, over-the-counter solutions and was recently seen by his PCP who noted cellulitis and put him on Levaquin. His past medical history includes anemia, B12 deficiency, BPH, hemorrhoids. He quit smoking in 1966 Objective Constitutional Pulse regular. Respirations normal and unlabored. Afebrile. Vitals Time  Taken: 2:32 PM, Height: 69 in, Weight: 180 lbs, BMI: 26.6, Temperature: 98.3 F, Pulse: 80 bpm, Respiratory Rate: 18 breaths/min, Blood Pressure: 126/79 mmHg. Eyes Nonicteric. Reactive to light. Ears, Nose, Mouth, and Throat Clyne, Kermit K. ([id number]) Lips, teeth, and gums WNL.Marland Kitchen Moist mucosa without lesions . Neck supple and nontender. No palpable supraclavicular or cervical adenopathy. Normal sized without goiter. Respiratory WNL. No retractions.. Cardiovascular Pedal Pulses WNL. No clubbing, cyanosis or edema. Chest Breasts symmetical and no nipple discharge.. Breast tissue WNL, no masses, lumps, or tenderness.. Lymphatic No adneopathy. No adenopathy. No adenopathy. Musculoskeletal Adexa without tenderness or enlargement.. Digits and nails w/o clubbing, cyanosis, infection, petechiae, ischemia, or inflammatory conditions.Marland Kitchen Psychiatric Judgement and insight Intact.. No evidence of depression, anxiety, or agitation.. General Notes: He still has some amount of eschar though it is looking much better than last week. I have used a curetted sharply debrided down to healthy subcutaneous tissue. Integumentary (Hair, Skin) No suspicious lesions. No crepitus or fluctuance. No peri-wound warmth or erythema. No masses.. Wound #1 status is Open. Original cause of wound was Gradually Appeared. The wound is located on the Left,Anterior Forearm. The wound measures 1.4cm length x 1.2cm width x 0.1cm depth; 1.319cm^2 area and 0.132cm^3 volume. The wound is limited to skin breakdown. There is no tunneling or undermining noted. There is a small amount of serosanguineous drainage noted. The wound margin is distinct with the outline attached to the wound base. There is small (1-33%) pink granulation within the wound bed. There is a large (67-100%) amount of necrotic tissue within the wound bed including Eschar. The periwound skin appearance exhibited: Scarring, Moist. The periwound skin appearance did  not exhibit: Callus, Crepitus, Excoriation, Fluctuance, Friable, Induration, Localized Edema, Rash, Dry/Scaly, Maceration, Atrophie Blanche, Cyanosis, Ecchymosis, Hemosiderin Staining, Mottled, Pallor, Rubor, Erythema. Periwound temperature was noted as No Abnormality. Assessment Active Problems ICD-10 DEVONTRE, SIEDSCHLAG ([id number]) [phone] - Laceration without foreign body of left forearm, initial encounter Procedures Wound #1 Wound #1 is a Skin Tear located on the Left,Anterior Forearm . There was a Skin/Subcutaneous Tissue Debridement ([id number]) debridement with total area of 1.68 sq cm performed by Chesley Valls, Jackson Latino., MD. with the following instrument(s): Curette to remove Viable and Non-Viable tissue/material including Exudate, Fibrin/Slough, and Subcutaneous after achieving pain control using Other (lidocaine 4%). A time out was conducted prior to the start of the procedure. A Minimum amount of bleeding was controlled with Pressure. The procedure was tolerated well with a pain level of 0 throughout and a pain level of 0 following the procedure. Post Debridement Measurements: 1.4cm length x 1.2cm width x 0.1cm depth; 0.132cm^3 volume. Post procedure Diagnosis Wound #1: Same as Pre-Procedure Plan Wound Cleansing: Wound #1 Left,Anterior Forearm: Cleanse wound with mild soap and water Anesthetic: Wound #1 Left,Anterior Forearm: Topical Lidocaine 4% cream applied to wound bed prior to debridement Skin Barriers/Peri-Wound Care: Wound #1 Left,Anterior Forearm: Skin Prep Primary Wound Dressing: Wound #1 Left,Anterior Forearm: Hydrafera Blue Secondary Dressing: Wound #1 Left,Anterior Forearm: Boardered Foam Dressing Dressing Change Frequency: Wound #1 Left,Anterior Forearm: Change dressing every other day. Follow-up Appointments:  Wound #1 Left,Anterior Forearm: Return Appointment in 1 week. ROSALIO, CATTERTON. ([id number]) I have recommended we continue with Hydrofera Blue and bordered  foam dressing and he is changing it every day because he gets it wet in the shower. He will come back and see me next week. Electronic Signature(s) Signed: 06/07/2015 2:52:54 PM By: Christin Fudge MD, FACS Entered By: Christin Fudge on 06/07/2015 14:52:54 Sorenson, Earney Hamburg ([id number]) -------------------------------------------------------------------------------- SuperBill Details Patient Name: [name]. Date of Service: 06/07/2015 Medical Record Number: [id number] Patient Account Number: [id number] Date of Birth/Sex: 09-20-1930 (81 y.o. Male) Treating RN: Montey Hora Primary Care Physician: Fulton Reek Other Clinician: Referring Physician: Fulton Reek Treating Physician/Extender: Frann Rider in Treatment: 1 Diagnosis Coding ICD-10 Codes Code Description [phone] Laceration without foreign body of left forearm, initial encounter Facility Procedures CPT4 Code Description: [id number] [place] - DEB SUBQ TISSUE 20 SQ CM/< ICD-10 Description Diagnosis S51.812A Laceration without foreign body of left forearm, init Modifier: ial encounte Quantity: 1 r Physician Procedures CPT4 Code Description: [id number] 11042 - WC PHYS SUBQ TISS 20 SQ CM ICD-10 Description Diagnosis S51.812A Laceration without foreign body of left forearm, init Modifier: ial encounter Quantity: 1 Electronic Signature(s) Signed: 06/07/2015 2:53:03 PM By: Christin Fudge MD, FACS Entered By: Christin Fudge on 06/07/2015 14:53:03

## 2015-06-14 ENCOUNTER — Encounter: Payer: Commercial Managed Care - HMO | Attending: Surgery | Admitting: Surgery

## 2015-06-14 DIAGNOSIS — S51812A Laceration without foreign body of left forearm, initial encounter: Secondary | ICD-10-CM | POA: Diagnosis not present

## 2015-06-14 DIAGNOSIS — X58XXXA Exposure to other specified factors, initial encounter: Secondary | ICD-10-CM | POA: Insufficient documentation

## 2015-06-15 NOTE — Progress Notes (Signed)
[name] ([id number]) Visit Report for 06/14/2015 Chief Complaint Document Details Patient Name: [name], [name]. Date of Service: 06/14/2015 1:00 PM Medical Record Number: [id number] Patient Account Number: [id number] Date of Birth/Sex: 1930-11-05 (81 y.o. Male) Treating RN: Cornell Barman Primary Care Physician: Fulton Reek Other Clinician: Referring Physician: Fulton Reek Treating Physician/Extender: Frann Rider in Treatment: 2 Information Obtained from: Patient Chief Complaint Patient presents to the wound care center for a consult due non healing wound. 81 year old gentleman who had a lacerated wound to his left forearm about 17 days ago. Electronic Signature(s) Signed: 06/14/2015 1:40:32 PM By: Christin Fudge MD, FACS Entered By: Christin Fudge on 06/14/2015 13:40:31 [name] ([id number]) -------------------------------------------------------------------------------- HPI Details Patient Name: [name]. Date of Service: 06/14/2015 1:00 PM Medical Record Number: [id number] Patient Account Number: [id number] Date of Birth/Sex: 02/08/1931 (81 y.o. Male) Treating RN: Cornell Barman Primary Care Physician: Fulton Reek Other Clinician: Referring Physician: Fulton Reek Treating Physician/Extender: Frann Rider in Treatment: 2 History of Present Illness Location: left forearm laceration Quality: Patient reports No Pain. Severity: Patient states wound are getting worse. Duration: Patient has had the wound for < 3 weeks prior to presenting for treatment Context: The wound occurred when the patient had a laceration at home against a bedpost Modifying Factors: Other treatment(s) tried include:various local medications plus Levaquin Associated Signs and Symptoms: Wound has not had a purlulent drainage HPI Description: spry 81 year old gentleman who had a lacerated wound to his left forearm 17 days ago. He tried various local remedies including anti-biotic  ointment, over-the-counter solutions and was recently seen by his PCP who noted cellulitis and put him on Levaquin. His past medical history includes anemia, B12 deficiency, BPH, hemorrhoids. He quit smoking in 1966 06/14/2015 -- he has developed some keratosis next to his wound and was concerned about that. Electronic Signature(s) Signed: 06/14/2015 1:41:04 PM By: Christin Fudge MD, FACS Entered By: Christin Fudge on 06/14/2015 13:41:04 [name] -------------------------------------------------------------------------------- Physical Exam Details Patient Name: [name]. Date of Service: 06/14/2015 1:00 PM Medical Record Number: [id number] Patient Account Number: [id number] Date of Birth/Sex: 1931-07-04 (81 y.o. Male) Treating RN: Cornell Barman Primary Care Physician: Fulton Reek Other Clinician: Referring Physician: Fulton Reek Treating Physician/Extender: Frann Rider in Treatment: 2 Constitutional . Pulse regular. Respirations normal and unlabored. Afebrile. . Eyes Nonicteric. Reactive to light. Ears, Nose, Mouth, and Throat Lips, teeth, and gums WNL.Marland Kitchen Moist mucosa without lesions . Neck supple and nontender. No palpable supraclavicular or cervical adenopathy. Normal sized without goiter. Respiratory WNL. No retractions.. Cardiovascular Pedal Pulses WNL. No clubbing, cyanosis or edema. Lymphatic No adneopathy. No adenopathy. No adenopathy. Musculoskeletal Adexa without tenderness or enlargement.. Digits and nails w/o clubbing, cyanosis, infection, petechiae, ischemia, or inflammatory conditions.. Integumentary (Hair, Skin) No suspicious lesions. No crepitus or fluctuance. No peri-wound warmth or erythema. No masses.Marland Kitchen Psychiatric Judgement and insight Intact.. No evidence of depression, anxiety, or agitation.. Notes Wound base is clean and it is much smaller than last week. He has got some keratosis at the edge of the wound and I have asked him  to leave this alone. Electronic Signature(s) Signed: 06/14/2015 1:41:50 PM By: Christin Fudge MD, FACS Entered By: Christin Fudge on 06/14/2015 13:41:50 [name] -------------------------------------------------------------------------------- Physician Orders Details Patient Name: [name]. Date of Service: 06/14/2015 1:00 PM Medical Record Number: [id number] Patient Account Number: [id number] Date of Birth/Sex: Sep 08, 1931 (81 y.o. Male) Treating RN: Cornell Barman Primary Care Physician: Fulton Reek Other Clinician: Referring Physician:  Fulton Reek Treating Physician/Extender: Frann Rider in Treatment: 2 Verbal / Phone Orders: Yes Clinician: Cornell Barman Read Back and Verified: Yes Diagnosis Coding Wound Cleansing Wound #1 Left,Anterior Forearm o Cleanse wound with mild soap and water Anesthetic Wound #1 Left,Anterior Forearm o Topical Lidocaine 4% cream applied to wound bed prior to debridement Skin Barriers/Peri-Wound Care Wound #1 Left,Anterior Forearm o Skin Prep Primary Wound Dressing Wound #1 Left,Anterior Forearm o Prisma Ag Secondary Dressing Wound #1 Left,Anterior Forearm o Boardered Foam Dressing Dressing Change Frequency Wound #1 Left,Anterior Forearm o Change dressing every other day. Follow-up Appointments Wound #1 Left,Anterior Forearm o Return Appointment in 1 week. Electronic Signature(s) Signed: 06/14/2015 5:06:09 PM By: Christin Fudge MD, FACS Signed: 06/14/2015 5:33:23 PM By: Gretta Cool RN, BSN, Kim RN, BSN Entered By: Gretta Cool, RN, BSN, Kim on 06/14/2015 13:20:50 [name] ([name], [name] ([id number]) -------------------------------------------------------------------------------- Problem List Details Patient Name: [name], MONDAY. Date of Service: 06/14/2015 1:00 PM Medical Record Number: [id number] Patient Account Number: [id number] Date of Birth/Sex: 1931-07-08 (81 y.o. Male) Treating RN: Cornell Barman Primary Care Physician: Fulton Reek Other Clinician: Referring Physician: Fulton Reek Treating Physician/Extender: Frann Rider in Treatment: 2 Active Problems ICD-10 Encounter Code Description Active Date Diagnosis [phone] Laceration without foreign body of left forearm, initial 05/31/2015 Yes encounter Inactive Problems Resolved Problems Electronic Signature(s) Signed: 06/14/2015 1:40:25 PM By: Christin Fudge MD, FACS Entered By: Christin Fudge on 06/14/2015 13:40:25 Rietz, [name] ([id number]) -------------------------------------------------------------------------------- Progress Note Details Patient Name: [name]. Date of Service: 06/14/2015 1:00 PM Medical Record Number: [id number] Patient Account Number: [id number] Date of Birth/Sex: 04/14/1931 (81 y.o. Male) Treating RN: Cornell Barman Primary Care Physician: Fulton Reek Other Clinician: Referring Physician: Fulton Reek Treating Physician/Extender: Frann Rider in Treatment: 2 Subjective Chief Complaint Information obtained from Patient Patient presents to the wound care center for a consult due non healing wound. 81 year old gentleman who had a lacerated wound to his left forearm about 17 days ago. History of Present Illness (HPI) The following HPI elements were documented for the patient's wound: Location: left forearm laceration Quality: Patient reports No Pain. Severity: Patient states wound are getting worse. Duration: Patient has had the wound for < 3 weeks prior to presenting for treatment Context: The wound occurred when the patient had a laceration at home against a bedpost Modifying Factors: Other treatment(s) tried include:various local medications plus Levaquin Associated Signs and Symptoms: Wound has not had a purlulent drainage spry 81 year old gentleman who had a lacerated wound to his left forearm 17 days ago. He tried various local remedies including anti-biotic  ointment, over-the-counter solutions and was recently seen by his PCP who noted cellulitis and put him on Levaquin. His past medical history includes anemia, B12 deficiency, BPH, hemorrhoids. He quit smoking in 1966 06/14/2015 -- he has developed some keratosis next to his wound and was concerned about that. Objective Constitutional Pulse regular. Respirations normal and unlabored. Afebrile. Vitals Time Taken: 1:07 PM, Height: 69 in, Weight: 180 lbs, BMI: 26.6, Temperature: 98.2 F, Pulse: 88 bpm, Respiratory Rate: 16 breaths/min, Blood Pressure: 136/67 mmHg. Eyes Nonicteric. Reactive to light. Galer, Kamani K. ([id number]) Ears, Nose, Mouth, and Throat Lips, teeth, and gums WNL.Marland Kitchen Moist mucosa without lesions . Neck supple and nontender. No palpable supraclavicular or cervical adenopathy. Normal sized without goiter. Respiratory WNL. No retractions.. Cardiovascular Pedal Pulses WNL. No clubbing, cyanosis or edema. Lymphatic No adneopathy. No adenopathy. No adenopathy. Musculoskeletal Adexa without tenderness or enlargement.. Digits and nails w/o clubbing, cyanosis,  infection, petechiae, ischemia, or inflammatory conditions.Marland Kitchen Psychiatric Judgement and insight Intact.. No evidence of depression, anxiety, or agitation.. General Notes: Wound base is clean and it is much smaller than last week. He has got some keratosis at the edge of the wound and I have asked him to leave this alone. Integumentary (Hair, Skin) No suspicious lesions. No crepitus or fluctuance. No peri-wound warmth or erythema. No masses.. Wound #1 status is Open. Original cause of wound was Gradually Appeared. The wound is located on the Left,Anterior Forearm. The wound measures 0.7cm length x 0.6cm width x 0.1cm depth; 0.33cm^2 area and 0.033cm^3 volume. The wound is limited to skin breakdown. There is no tunneling or undermining noted. There is a small amount of serosanguineous drainage noted. The wound margin is  distinct with the outline attached to the wound base. There is small (1-33%) pink granulation within the wound bed. There is a large (67-100%) amount of necrotic tissue within the wound bed including Eschar. The periwound skin appearance exhibited: Scarring, Moist. The periwound skin appearance did not exhibit: Callus, Crepitus, Excoriation, Fluctuance, Friable, Induration, Localized Edema, Rash, Dry/Scaly, Maceration, Atrophie Blanche, Cyanosis, Ecchymosis, Hemosiderin Staining, Mottled, Pallor, Rubor, Erythema. Periwound temperature was noted as No Abnormality. Assessment Active Problems ICD-10 G62.694W - Laceration without foreign body of left forearm, initial encounter Glymph, Yahir K. ([id number]) I have recommended silver collagen and a bordered foam over this. He has some keratosis at the edge of the wound which we will leave alone for the present time. Plan Wound Cleansing: Wound #1 Left,Anterior Forearm: Cleanse wound with mild soap and water Anesthetic: Wound #1 Left,Anterior Forearm: Topical Lidocaine 4% cream applied to wound bed prior to debridement Skin Barriers/Peri-Wound Care: Wound #1 Left,Anterior Forearm: Skin Prep Primary Wound Dressing: Wound #1 Left,Anterior Forearm: Prisma Ag Secondary Dressing: Wound #1 Left,Anterior Forearm: Boardered Foam Dressing Dressing Change Frequency: Wound #1 Left,Anterior Forearm: Change dressing every other day. Follow-up Appointments: Wound #1 Left,Anterior Forearm: Return Appointment in 1 week. I have recommended silver collagen and a bordered foam over this. He has some keratosis at the edge of the wound which we will leave alone for the present time. Electronic Signature(s) Signed: 06/14/2015 1:42:24 PM By: Christin Fudge MD, FACS Entered By: Christin Fudge on 06/14/2015 13:42:24 Schiefelbein, [name] ([id number]) -------------------------------------------------------------------------------- SuperBill Details Patient Name: [name]. Date of Service: 06/14/2015 Medical Record Number: [id number] Patient Account Number: [id number] Date of Birth/Sex: Aug 10, 1931 (81 y.o. Male) Treating RN: Cornell Barman Primary Care Physician: Fulton Reek Other Clinician: Referring Physician: Fulton Reek Treating Physician/Extender: Frann Rider in Treatment: 2 Diagnosis Coding ICD-10 Codes Code Description [phone] Laceration without foreign body of left forearm, initial encounter Facility Procedures CPT4 Code: [id number] Description: [phone] - WOUND CARE VISIT-LEV 2 EST PT Modifier: Quantity: 1 Physician Procedures CPT4 Code Description: [id number] - WC PHYS LEVEL 3 - EST PT ICD-10 Description Diagnosis S51.812A Laceration without foreign body of left forearm, init Modifier: ial encounter Quantity: 1 Electronic Signature(s) Signed: 06/14/2015 1:42:37 PM By: Christin Fudge MD, FACS Entered By: Christin Fudge on 06/14/2015 13:42:37

## 2015-06-15 NOTE — Progress Notes (Signed)
BOBY, EYER ([id number]) Visit Report for 06/14/2015 Arrival Information Details Patient Name: [name], [name]. Date of Service: 06/14/2015 1:00 PM Medical Record Number: [id number] Patient Account Number: [id number] Date of Birth/Sex: Jan 22, 1931 (81 y.o. Male) Treating RN: Cornell Barman Primary Care Physician: Fulton Reek Other Clinician: Referring Physician: Fulton Reek Treating Physician/Extender: Frann Rider in Treatment: 2 Visit Information History Since Last Visit Added or deleted any medications: Yes Patient Arrived: Ambulatory Any new allergies or adverse reactions: No Arrival Time: 13:06 Had a fall or experienced change in No Accompanied By: self activities of daily living that may affect Transfer Assistance: None risk of falls: Patient Identification Verified: Yes Signs or symptoms of abuse/neglect since last No Secondary Verification Process Yes visito Completed: Hospitalized since last visit: No Patient Requires Transmission-Based No Has Dressing in Place as Prescribed: Yes Precautions: Pain Present Now: No Patient Has Alerts: No Electronic Signature(s) Signed: 06/14/2015 5:33:23 PM By: Gretta Cool, RN, BSN, Kim RN, BSN Entered By: Gretta Cool, RN, BSN, Kim on 06/14/2015 13:06:39 [name], [name] ([id number]) -------------------------------------------------------------------------------- Clinic Level of Care Assessment Details Patient Name: [name]. Date of Service: 06/14/2015 1:00 PM Medical Record Number: [id number] Patient Account Number: [id number] Date of Birth/Sex: 17-Feb-1931 (81 y.o. Male) Treating RN: Cornell Barman Primary Care Physician: Fulton Reek Other Clinician: Referring Physician: Fulton Reek Treating Physician/Extender: Frann Rider in Treatment: 2 Clinic Level of Care Assessment Items TOOL 4 Quantity Score []  - Use when only an EandM is performed on FOLLOW-UP visit 0 ASSESSMENTS - Nursing Assessment / Reassessment []  -  Reassessment of Co-morbidities (includes updates in patient status) 0 X - Reassessment of Adherence to Treatment Plan 1 5 ASSESSMENTS - Wound and Skin Assessment / Reassessment X - Simple Wound Assessment / Reassessment - one wound 1 5 []  - Complex Wound Assessment / Reassessment - multiple wounds 0 []  - Dermatologic / Skin Assessment (not related to wound area) 0 ASSESSMENTS - Focused Assessment []  - Circumferential Edema Measurements - multi extremities 0 []  - Nutritional Assessment / Counseling / Intervention 0 []  - Lower Extremity Assessment (monofilament, tuning fork, pulses) 0 []  - Peripheral Arterial Disease Assessment (using hand held doppler) 0 ASSESSMENTS - Ostomy and/or Continence Assessment and Care []  - Incontinence Assessment and Management 0 []  - Ostomy Care Assessment and Management (repouching, etc.) 0 PROCESS - Coordination of Care X - Simple Patient / Family Education for ongoing care 1 15 []  - Complex (extensive) Patient / Family Education for ongoing care 0 []  - Staff obtains Programmer, systems, Records, Test Results / Process Orders 0 []  - Staff telephones HHA, Nursing Homes / Clarify orders / etc 0 []  - Routine Transfer to another Facility (non-emergent condition) 0 Lutze, Kadon K. ([id number]) []  - Routine Hospital Admission (non-emergent condition) 0 []  - New Admissions / Biomedical engineer / Ordering NPWT, Apligraf, etc. 0 []  - Emergency Hospital Admission (emergent condition) 0 X - Simple Discharge Coordination 1 10 []  - Complex (extensive) Discharge Coordination 0 PROCESS - Special Needs []  - Pediatric / Minor Patient Management 0 []  - Isolation Patient Management 0 []  - Hearing / Language / Visual special needs 0 []  - Assessment of Community assistance (transportation, D/C planning, etc.) 0 []  - Additional assistance / Altered mentation 0 []  - Support Surface(s) Assessment (bed, cushion, seat, etc.) 0 INTERVENTIONS - Wound Cleansing / Measurement X - Simple  Wound Cleansing - one wound 1 5 []  - Complex Wound Cleansing - multiple wounds 0 X - Wound Imaging (photographs - any number of wounds)  1 5 []  - Wound Tracing (instead of photographs) 0 X - Simple Wound Measurement - one wound 1 5 []  - Complex Wound Measurement - multiple wounds 0 INTERVENTIONS - Wound Dressings X - Small Wound Dressing one or multiple wounds 1 10 []  - Medium Wound Dressing one or multiple wounds 0 []  - Large Wound Dressing one or multiple wounds 0 []  - Application of Medications - topical 0 []  - Application of Medications - injection 0 INTERVENTIONS - Miscellaneous []  - External ear exam 0 Yoshimura, Colon K. ([id number]) []  - Specimen Collection (cultures, biopsies, blood, body fluids, etc.) 0 []  - Specimen(s) / Culture(s) sent or taken to Lab for analysis 0 []  - Patient Transfer (multiple staff / Civil Service fast streamer / Similar devices) 0 []  - Simple Staple / Suture removal (25 or less) 0 []  - Complex Staple / Suture removal (26 or more) 0 []  - Hypo / Hyperglycemic Management (close monitor of Blood Glucose) 0 []  - Ankle / Brachial Index (ABI) - do not check if billed separately 0 X - Vital Signs 1 5 Has the patient been seen at the hospital within the last three years: Yes Total Score: 65 Level Of Care: New/Established - Level 2 Electronic Signature(s) Signed: 06/14/2015 5:33:23 PM By: Gretta Cool, RN, BSN, Kim RN, BSN Entered By: Gretta Cool, RN, BSN, Kim on 06/14/2015 13:24:58 [name], [name] ([id number]) -------------------------------------------------------------------------------- Encounter Discharge Information Details Patient Name: [name]. Date of Service: 06/14/2015 1:00 PM Medical Record Number: [id number] Patient Account Number: [id number] Date of Birth/Sex: [birth year] (81 y.o. Male) Treating RN: Cornell Barman Primary Care Physician: Fulton Reek Other Clinician: Referring Physician: Fulton Reek Treating Physician/Extender: Frann Rider in Treatment: 2 Encounter  Discharge Information Items Discharge Pain Level: 0 Discharge Condition: Stable Ambulatory Status: Ambulatory Discharge Destination: Home Transportation: Private Auto Accompanied By: self Schedule Follow-up Appointment: Yes Medication Reconciliation completed and provided to Patient/Care Yes Anaissa Macfadden: Provided on Clinical Summary of Care: 06/14/2015 Form Type Recipient Paper Patient JB Electronic Signature(s) Signed: 06/14/2015 5:33:23 PM By: Gretta Cool, RN, BSN, Kim RN, BSN Previous Signature: 06/14/2015 1:24:59 PM Version By: Ruthine Dose Entered By: Gretta Cool RN, BSN, Kim on 06/14/2015 13:26:21 [name], [name] ([id number]) -------------------------------------------------------------------------------- Multi Wound Chart Details Patient Name: [name]. Date of Service: 06/14/2015 1:00 PM Medical Record Number: [id number] Patient Account Number: [id number] Date of Birth/Sex: 1931-02-13 (81 y.o. Male) Treating RN: Cornell Barman Primary Care Physician: Fulton Reek Other Clinician: Referring Physician: Fulton Reek Treating Physician/Extender: Frann Rider in Treatment: 2 Vital Signs Height(in): 69 [name](bpm): 88 Weight(lbs): 180 Blood Pressure 136/67 (mmHg): Body Mass Index(BMI): 27 Temperature(F): 98.2 Respiratory Rate 16 (breaths/min): Photos: [1:No Photos] [N/A:N/A] Wound Location: [1:Left Forearm - Anterior] [N/A:N/A] Wounding Event: [1:Gradually Appeared] [N/A:N/A] Primary Etiology: [1:Skin Tear] [N/A:N/A] Comorbid History: [1:Cataracts] [N/A:N/A] Date Acquired: [1:05/14/2015] [N/A:N/A] Weeks of Treatment: [1:2] [N/A:N/A] Wound Status: [1:Open] [N/A:N/A] Measurements L x W x D 0.7x0.6x0.1 [N/A:N/A] (cm) Area (cm) : [1:0.33] [N/A:N/A] Volume (cm) : [1:0.033] [N/A:N/A] % Reduction in Area: [1:87.00%] [N/A:N/A] % Reduction in Volume: 87.00% [N/A:N/A] Classification: [1:Full Thickness Without Exposed Support Structures] [N/A:N/A] Exudate Amount: [1:Small]  [N/A:N/A] Exudate Type: [1:Serosanguineous] [N/A:N/A] Exudate Color: [1:red, brown] [N/A:N/A] Wound Margin: [1:Distinct, outline attached] [N/A:N/A] Granulation Amount: [1:Small (1-33%)] [N/A:N/A] Granulation Quality: [1:Pink] [N/A:N/A] Necrotic Amount: [1:Large (67-100%)] [N/A:N/A] Necrotic Tissue: [1:Eschar] [N/A:N/A] Exposed Structures: [1:Fascia: No Fat: No Tendon: No Muscle: No Joint: No] [N/A:N/A] Bone: No Limited to Skin Breakdown Epithelialization: Medium (34-66%) N/A N/A Periwound Skin Texture: Scarring: Yes N/A N/A Edema: No Excoriation: No  Induration: No Callus: No Crepitus: No Fluctuance: No Friable: No Rash: No Periwound Skin Moist: Yes N/A N/A Moisture: Maceration: No Dry/Scaly: No Periwound Skin Color: Atrophie Blanche: No N/A N/A Cyanosis: No Ecchymosis: No Erythema: No Hemosiderin Staining: No Mottled: No Pallor: No Rubor: No Temperature: No Abnormality N/A N/A Tenderness on No N/A N/A Palpation: Wound Preparation: Ulcer Cleansing: N/A N/A Rinsed/Irrigated with Saline Topical Anesthetic Applied: Other: lidocaine 4 % Treatment Notes Electronic Signature(s) Signed: 06/14/2015 5:33:23 PM By: Gretta Cool, RN, BSN, Kim RN, BSN Entered By: Gretta Cool, RN, BSN, Kim on 06/14/2015 13:12:35 [name], [name] ([id number]) -------------------------------------------------------------------------------- Multi-Disciplinary Care Plan Details Patient Name: [name], [name]. Date of Service: 06/14/2015 1:00 PM Medical Record Number: [id number] Patient Account Number: [id number] Date of Birth/Sex: June 22, 1931 (81 y.o. Male) Treating RN: Cornell Barman Primary Care Physician: Fulton Reek Other Clinician: Referring Physician: Fulton Reek Treating Physician/Extender: Frann Rider in Treatment: 2 Active Inactive Orientation to the Wound Care Program Nursing Diagnoses: Knowledge deficit related to the wound healing center program Goals: Patient/caregiver will verbalize  understanding of the [facility] Date Initiated: 05/31/2015 Goal Status: Active Interventions: Provide education on orientation to the wound center Notes: Wound/Skin Impairment Nursing Diagnoses: Impaired tissue integrity Knowledge deficit related to ulceration/compromised skin integrity Goals: Patient/caregiver will verbalize understanding of skin care regimen Date Initiated: 05/31/2015 Goal Status: Active Ulcer/skin breakdown will have a volume reduction of 30% by week 4 Date Initiated: 05/31/2015 Goal Status: Active Ulcer/skin breakdown will have a volume reduction of 50% by week 8 Date Initiated: 05/31/2015 Goal Status: Active Ulcer/skin breakdown will have a volume reduction of 80% by week 12 Date Initiated: 05/31/2015 Goal Status: Active Ulcer/skin breakdown will heal within 14 weeks Date Initiated: 05/31/2015 [name], [name] ([id number]) Goal Status: Active Interventions: Assess patient/caregiver ability to obtain necessary supplies Assess patient/caregiver ability to perform ulcer/skin care regimen upon admission and as needed Assess ulceration(s) every visit Provide education on ulcer and skin care Notes: Electronic Signature(s) Signed: 06/14/2015 5:33:23 PM By: Gretta Cool, RN, BSN, Kim RN, BSN Entered By: Gretta Cool, RN, BSN, Kim on 06/14/2015 13:12:28 [name], [name] ([id number]) -------------------------------------------------------------------------------- Pain Assessment Details Patient Name: [name]. Date of Service: 06/14/2015 1:00 PM Medical Record Number: [id number] Patient Account Number: [id number] Date of Birth/Sex: 1931/06/16 (81 y.o. Male) Treating RN: Cornell Barman Primary Care Physician: Fulton Reek Other Clinician: Referring Physician: Fulton Reek Treating Physician/Extender: Frann Rider in Treatment: 2 Active Problems Location of Pain Severity and Description of Pain Patient Has Paino No Site Locations Pain Management and  Medication Current Pain Management: Electronic Signature(s) Signed: 06/14/2015 5:33:23 PM By: Gretta Cool, RN, BSN, Kim RN, BSN Entered By: Gretta Cool, RN, BSN, Kim on 06/14/2015 13:07:36 [name], [name] ([id number]) -------------------------------------------------------------------------------- Patient/Caregiver Education Details Patient Name: [name]. Date of Service: 06/14/2015 1:00 PM Medical Record Number: [id number] Patient Account Number: [id number] Date of Birth/Gender: 01-07-1931 (81 y.o. Male) Treating RN: Cornell Barman Primary Care Physician: Fulton Reek Other Clinician: Referring Physician: Fulton Reek Treating Physician/Extender: Frann Rider in Treatment: 2 Education Assessment Education Provided To: Patient Education Topics Provided Wound/Skin Impairment: Handouts: Caring for Your Ulcer, Other: continue wound care as prescribed Electronic Signature(s) Signed: 06/14/2015 5:33:23 PM By: Gretta Cool, RN, BSN, Kim RN, BSN Entered By: Gretta Cool, RN, BSN, Kim on 06/14/2015 13:26:38 [name], [name] ([id number]) -------------------------------------------------------------------------------- Wound Assessment Details Patient Name: [name]. Date of Service: 06/14/2015 1:00 PM Medical Record Number: [id number] Patient Account Number: [id number] Date of Birth/Sex: 1930-09-23 (81 y.o. Male) Treating RN: Cornell Barman Primary  Care Physician: Fulton Reek Other Clinician: Referring Physician: Fulton Reek Treating Physician/Extender: Frann Rider in Treatment: 2 Wound Status Wound Number: 1 Primary Etiology: Skin Tear Wound Location: Left Forearm - Anterior Wound Status: Open Wounding Event: Gradually Appeared Comorbid History: Cataracts Date Acquired: 05/14/2015 Weeks Of Treatment: 2 Clustered Wound: No Photos Photo Uploaded By: Gretta Cool, RN, BSN, Kim on 06/14/2015 13:15:37 Wound Measurements Length: (cm) 0.7 Width: (cm) 0.6 Depth: (cm) 0.1 Area: (cm)  0.33 Volume: (cm) 0.033 % Reduction in Area: 87% % Reduction in Volume: 87% Epithelialization: Medium (34-66%) Tunneling: No Undermining: No Wound Description Full Thickness Without Exposed Classification: Support Structures Wound Margin: Distinct, outline attached Exudate Small Amount: Exudate Type: Serosanguineous Exudate Color: red, brown Foul Odor After Cleansing: No Wound Bed Granulation Amount: Small (1-33%) Exposed Structure Granulation Quality: Pink Fascia Exposed: No Necrotic Amount: Large (67-100%) Fat Layer Exposed: No Schlick, Winford K. ([id number]) Necrotic Quality: Eschar Tendon Exposed: No Muscle Exposed: No Joint Exposed: No Bone Exposed: No Limited to Skin Breakdown Periwound Skin Texture Texture Color No Abnormalities Noted: No No Abnormalities Noted: No Callus: No Atrophie Blanche: No Crepitus: No Cyanosis: No Excoriation: No Ecchymosis: No Fluctuance: No Erythema: No Friable: No Hemosiderin Staining: No Induration: No Mottled: No Localized Edema: No Pallor: No Rash: No Rubor: No Scarring: Yes Temperature / Pain Moisture Temperature: No Abnormality No Abnormalities Noted: No Dry / Scaly: No Maceration: No Moist: Yes Wound Preparation Ulcer Cleansing: Rinsed/Irrigated with Saline Topical Anesthetic Applied: Other: lidocaine 4 %, Treatment Notes Wound #1 (Left, Anterior Forearm) 1. Cleansed with: Clean wound with Normal Saline 2. Anesthetic Topical Lidocaine 4% cream to wound bed prior to debridement 3. Peri-wound Care: Skin Prep 4. Dressing Applied: Prisma Ag 5. Secondary Dressing Applied Bordered Foam Dressing Electronic Signature(s) Signed: 06/14/2015 5:33:23 PM By: Gretta Cool, RN, BSN, Kim RN, BSN Entered By: Gretta Cool, RN, BSN, Kim on 06/14/2015 13:12:20 Quin, [name] ([id number]) -------------------------------------------------------------------------------- Vitals Details Patient Name: [name]. Date of Service: 06/14/2015  1:00 PM Medical Record Number: [id number] Patient Account Number: [id number] Date of Birth/Sex: 1930-12-24 (81 y.o. Male) Treating RN: Cornell Barman Primary Care Physician: Fulton Reek Other Clinician: Referring Physician: Fulton Reek Treating Physician/Extender: Frann Rider in Treatment: 2 Vital Signs Time Taken: 13:07 Temperature (F): 98.2 Height (in): 69 [name] (bpm): 88 Weight (lbs): 180 Respiratory Rate (breaths/min): 16 Body Mass Index (BMI): 26.6 Blood Pressure (mmHg): 136/67 Reference Range: 80 - 120 mg / dl Electronic Signature(s) Signed: 06/14/2015 5:33:23 PM By: Gretta Cool, RN, BSN, Kim RN, BSN Entered By: Gretta Cool, RN, BSN, Kim on 06/14/2015 13:07:53

## 2015-06-17 DIAGNOSIS — Z23 Encounter for immunization: Secondary | ICD-10-CM | POA: Diagnosis not present

## 2015-06-21 ENCOUNTER — Encounter: Payer: Commercial Managed Care - HMO | Admitting: Surgery

## 2015-06-21 DIAGNOSIS — S51812D Laceration without foreign body of left forearm, subsequent encounter: Secondary | ICD-10-CM | POA: Diagnosis not present

## 2015-06-21 DIAGNOSIS — S51812A Laceration without foreign body of left forearm, initial encounter: Secondary | ICD-10-CM | POA: Diagnosis not present

## 2015-06-21 NOTE — Progress Notes (Signed)
[name], [name] ([id number]) Visit Report for 06/21/2015 Arrival Information Details Patient Name: [name], [name]. Date of Service: 06/21/2015 8:30 AM Medical Record Number: [id number] Patient Account Number: [id number] Date of Birth/Sex: [birth year] (81 y.o. Male) Treating RN: Baruch Gouty, RN, BSN, Velva Harman Primary Care Physician: Fulton Reek Other Clinician: Referring Physician: Fulton Reek Treating Physician/Extender: Frann Rider in Treatment: 3 Visit Information History Since Last Visit Added or deleted any medications: No Patient Arrived: Ambulatory Any new allergies or adverse reactions: No Arrival Time: 08:22 Had a fall or experienced change in No Accompanied By: self activities of daily living that may affect Transfer Assistance: None risk of falls: Patient Identification Verified: Yes Signs or symptoms of abuse/neglect since last No Secondary Verification Process Yes visito Completed: Hospitalized since last visit: No Patient Requires Transmission-Based No Has Dressing in Place as Prescribed: Yes Precautions: Pain Present Now: No Patient Has Alerts: No Electronic Signature(s) Signed: 06/21/2015 8:24:11 AM By: Regan Lemming BSN, RN Entered By: Regan Lemming on 06/21/2015 08:24:11 [name], [name] -------------------------------------------------------------------------------- Clinic Level of Care Assessment Details Patient Name: [name]. Date of Service: 06/21/2015 8:30 AM Medical Record Number: [id number] Patient Account Number: [id number] Date of Birth/Sex: 01-Jan-1931 (81 y.o. Male) Treating RN: Baruch Gouty, RN, BSN, [place] Primary Care Physician: Fulton Reek Other Clinician: Referring Physician: Fulton Reek Treating Physician/Extender: Frann Rider in Treatment: 3 Clinic Level of Care Assessment Items TOOL 4 Quantity Score []  - Use when only an EandM is performed on FOLLOW-UP visit 0 ASSESSMENTS - Nursing Assessment / Reassessment X -  Reassessment of Co-morbidities (includes updates in patient status) 1 10 X - Reassessment of Adherence to Treatment Plan 1 5 ASSESSMENTS - Wound and Skin Assessment / Reassessment []  - Simple Wound Assessment / Reassessment - one wound 0 []  - Complex Wound Assessment / Reassessment - multiple wounds 0 []  - Dermatologic / Skin Assessment (not related to wound area) 0 ASSESSMENTS - Focused Assessment []  - Circumferential Edema Measurements - multi extremities 0 []  - Nutritional Assessment / Counseling / Intervention 0 []  - Lower Extremity Assessment (monofilament, tuning fork, pulses) 0 []  - Peripheral Arterial Disease Assessment (using hand held doppler) 0 ASSESSMENTS - Ostomy and/or Continence Assessment and Care []  - Incontinence Assessment and Management 0 []  - Ostomy Care Assessment and Management (repouching, etc.) 0 PROCESS - Coordination of Care X - Simple Patient / Family Education for ongoing care 1 15 []  - Complex (extensive) Patient / Family Education for ongoing care 0 []  - Staff obtains Programmer, systems, Records, Test Results / Process Orders 0 []  - Staff telephones HHA, Nursing Homes / Clarify orders / etc 0 []  - Routine Transfer to another Facility (non-emergent condition) 0 [name], [name] []  - Routine Hospital Admission (non-emergent condition) 0 []  - New Admissions / Biomedical engineer / Ordering NPWT, Apligraf, etc. 0 []  - Emergency Hospital Admission (emergent condition) 0 []  - Simple Discharge Coordination 0 []  - Complex (extensive) Discharge Coordination 0 PROCESS - Special Needs []  - Pediatric / Minor Patient Management 0 []  - Isolation Patient Management 0 []  - Hearing / Language / Visual special needs 0 []  - Assessment of Community assistance (transportation, D/C planning, etc.) 0 []  - Additional assistance / Altered mentation 0 []  - Support Surface(s) Assessment (bed, cushion, seat, etc.) 0 INTERVENTIONS - Wound Cleansing / Measurement []  - Simple  Wound Cleansing - one wound 0 []  - Complex Wound Cleansing - multiple wounds 0 X - Wound Imaging (photographs - any number of wounds) 1 5 []  -  Wound Tracing (instead of photographs) 0 []  - Simple Wound Measurement - one wound 0 []  - Complex Wound Measurement - multiple wounds 0 INTERVENTIONS - Wound Dressings []  - Small Wound Dressing one or multiple wounds 0 []  - Medium Wound Dressing one or multiple wounds 0 []  - Large Wound Dressing one or multiple wounds 0 []  - Application of Medications - topical 0 []  - Application of Medications - injection 0 INTERVENTIONS - Miscellaneous []  - External ear exam 0 [name], [name] []  - Specimen Collection (cultures, biopsies, blood, body fluids, etc.) 0 []  - Specimen(s) / Culture(s) sent or taken to Lab for analysis 0 []  - Patient Transfer (multiple staff / Harrel Lemon Lift / Similar devices) 0 []  - Simple Staple / Suture removal (25 or less) 0 []  - Complex Staple / Suture removal (26 or more) 0 []  - Hypo / Hyperglycemic Management (close monitor of Blood Glucose) 0 []  - Ankle / Brachial Index (ABI) - do not check if billed separately 0 X - Vital Signs 1 5 Has the patient been seen at the hospital within the last three years: Yes Total Score: 40 Level Of Care: New/Established - Level 2 Electronic Signature(s) Signed: 06/21/2015 8:35:13 AM By: Regan Lemming BSN, RN Entered By: Regan Lemming on 06/21/2015 08:35:13 [name], [name] -------------------------------------------------------------------------------- Encounter Discharge Information Details Patient Name: [name]. Date of Service: 06/21/2015 8:30 AM Medical Record Number: [id number] Patient Account Number: [id number] Date of Birth/Sex: [birth year] (81 y.o. Male) Treating RN: Baruch Gouty, RN, BSN, Velva Harman Primary Care Physician: Fulton Reek Other Clinician: Referring Physician: Fulton Reek Treating Physician/Extender: Frann Rider in Treatment: 3 Encounter Discharge  Information Items Discharge Pain Level: 0 Discharge Condition: Stable Ambulatory Status: Ambulatory Discharge Destination: Home Transportation: Private Auto Accompanied By: self Schedule Follow-up Appointment: No Medication Reconciliation completed and provided to Patient/Care No Beyonka Pitney: Provided on Clinical Summary of Care: 06/21/2015 Form Type Recipient Paper Patient JB Electronic Signature(s) Signed: 06/21/2015 8:43:26 AM By: Regan Lemming BSN, RN Previous Signature: 06/21/2015 8:35:20 AM Version By: Ruthine Dose Entered By: Regan Lemming on 06/21/2015 08:43:26 [name], Eric Hamburg ([id number]) -------------------------------------------------------------------------------- Lower Extremity Assessment Details Patient Name: [name]. Date of Service: 06/21/2015 8:30 AM Medical Record Number: [id number] Patient Account Number: [id number] Date of Birth/Sex: 02-21-1931 (81 y.o. Male) Treating RN: Baruch Gouty, RN, BSN, Velva Harman Primary Care Physician: Fulton Reek Other Clinician: Referring Physician: Fulton Reek Treating Physician/Extender: Frann Rider in Treatment: 3 Electronic Signature(s) Signed: 06/21/2015 8:24:25 AM By: Regan Lemming BSN, RN Entered By: Regan Lemming on 06/21/2015 08:24:25 [name] ([id number]) -------------------------------------------------------------------------------- Multi-Disciplinary Care Plan Details Patient Name: [name], [name]. Date of Service: 06/21/2015 8:30 AM Medical Record Number: [id number] Patient Account Number: [id number] Date of Birth/Sex: 1931-03-26 (81 y.o. Male) Treating RN: Baruch Gouty, RN, BSN, Velva Harman Primary Care Physician: Fulton Reek Other Clinician: Referring Physician: Fulton Reek Treating Physician/Extender: Frann Rider in Treatment: 3 Active Inactive Electronic Signature(s) Signed: 06/21/2015 8:33:51 AM By: Regan Lemming BSN, RN Entered By: Regan Lemming on 06/21/2015 08:33:51 Madej, Eric Hamburg  ([id number]) -------------------------------------------------------------------------------- Pain Assessment Details Patient Name: [name]. Date of Service: 06/21/2015 8:30 AM Medical Record Number: [id number] Patient Account Number: [id number] Date of Birth/Sex: 1930/12/11 (81 y.o. Male) Treating RN: Baruch Gouty, RN, BSN, Velva Harman Primary Care Physician: Fulton Reek Other Clinician: Referring Physician: Fulton Reek Treating Physician/Extender: Frann Rider in Treatment: 3 Active Problems Location of Pain Severity and Description of Pain Patient Has Paino No Site Locations Pain Management and Medication Current Pain Management: Electronic  Signature(s) Signed: 06/21/2015 8:24:17 AM By: Regan Lemming BSN, RN Entered By: Regan Lemming on 06/21/2015 08:24:17 [name], Eric Hamburg ([id number]) -------------------------------------------------------------------------------- Wound Assessment Details Patient Name: [name]. Date of Service: 06/21/2015 8:30 AM Medical Record Number: [id number] Patient Account Number: [id number] Date of Birth/Sex: 09-10-1931 (81 y.o. Male) Treating RN: Baruch Gouty, RN, BSN, [place] Primary Care Physician: Fulton Reek Other Clinician: Referring Physician: Fulton Reek Treating Physician/Extender: Frann Rider in Treatment: 3 Wound Status Wound Number: 1 Primary Etiology: Skin Tear Wound Location: Left Forearm - Anterior Wound Status: Healed - Epithelialized Wounding Event: Gradually Appeared Comorbid History: Cataracts Date Acquired: 05/14/2015 Weeks Of Treatment: 3 Clustered Wound: No Wound Measurements Length: (cm) 0 % Reduction in Width: (cm) 0 % Reduction in Depth: (cm) 0 Epithelializat Area: (cm) 0 Tunneling: Volume: (cm) 0 Undermining: Area: 100% Volume: 100% ion: Large (67-100%) No No Wound Description Full Thickness Without Exposed Foul Odor Aft Classification: Support Structures Wound Margin: Distinct, outline  attached Exudate None Present Amount: er Cleansing: No Wound Bed Granulation Amount: Large (67-100%) Exposed Structure Granulation Quality: Pink Fascia Exposed: No Necrotic Amount: None Present (0%) Fat Layer Exposed: No Tendon Exposed: No Muscle Exposed: No Joint Exposed: No Bone Exposed: No Limited to Skin Breakdown Periwound Skin Texture Texture Color No Abnormalities Noted: No No Abnormalities Noted: No Callus: No Atrophie Blanche: No Crepitus: No Cyanosis: No Excoriation: No Ecchymosis: No Fluctuance: No Erythema: No [name], Eric K. ([id number]) Friable: No Hemosiderin Staining: No Induration: No Mottled: No Localized Edema: No Pallor: No Rash: No Rubor: No Scarring: Yes Temperature / Pain Moisture Temperature: No Abnormality No Abnormalities Noted: No Dry / Scaly: No Maceration: No Moist: No Wound Preparation Ulcer Cleansing: Rinsed/Irrigated with Saline Topical Anesthetic Applied: None Electronic Signature(s) Signed: 06/21/2015 8:32:54 AM By: Regan Lemming BSN, RN Previous Signature: 06/21/2015 8:28:29 AM Version By: Regan Lemming BSN, RN Entered By: Regan Lemming on 06/21/2015 08:32:54 [name], Eric Hamburg ([id number]) -------------------------------------------------------------------------------- Vitals Details Patient Name: [name]. Date of Service: 06/21/2015 8:30 AM Medical Record Number: [id number] Patient Account Number: [id number] Date of Birth/Sex: Apr 26, 1931 (81 y.o. Male) Treating RN: Baruch Gouty, RN, BSN, [place] Primary Care Physician: Fulton Reek Other Clinician: Referring Physician: Fulton Reek Treating Physician/Extender: Frann Rider in Treatment: 3 Vital Signs Time Taken: 08:25 Temperature (F): 98.1 Height (in): 69 Pulse (bpm): 80 Weight (lbs): 180 Respiratory Rate (breaths/min): 17 Body Mass Index (BMI): 26.6 Blood Pressure (mmHg): 125/92 Reference Range: 80 - 120 mg / dl Electronic Signature(s) Signed: 06/21/2015 8:26:25  AM By: Regan Lemming BSN, RN Entered By: Regan Lemming on 06/21/2015 63:87:56

## 2015-06-22 NOTE — Progress Notes (Signed)
[name] ([id number]) Visit Report for 06/21/2015 Chief Complaint Document Details Patient Name: [name], [name]. Date of Service: 06/21/2015 8:30 AM Medical Record Number: [id number] Patient Account Number: [id number] Date of Birth/Sex: 15-Jan-1931 (81 y.o. Male) Treating RN: Cornell Barman Primary Care Physician: Fulton Reek Other Clinician: Referring Physician: Fulton Reek Treating Physician/Extender: Frann Rider in Treatment: 3 Information Obtained from: Patient Chief Complaint Patient presents to the wound care center for a consult due non healing wound. 81 year old gentleman who had a lacerated wound to his left forearm about 17 days ago. Electronic Signature(s) Signed: 06/21/2015 8:41:36 AM By: Christin Fudge MD, FACS Entered By: Christin Fudge on 06/21/2015 08:41:36 Newberry, Earney Hamburg ([id number]) -------------------------------------------------------------------------------- HPI Details Patient Name: [name]. Date of Service: 06/21/2015 8:30 AM Medical Record Number: [id number] Patient Account Number: [id number] Date of Birth/Sex: 1930-10-19 (81 y.o. Male) Treating RN: Cornell Barman Primary Care Physician: Fulton Reek Other Clinician: Referring Physician: Fulton Reek Treating Physician/Extender: Frann Rider in Treatment: 3 History of Present Illness Location: left forearm laceration Quality: Patient reports No Pain. Severity: Patient states wound are getting worse. Duration: Patient has had the wound for < 3 weeks prior to presenting for treatment Context: The wound occurred when the patient had a laceration at home against a bedpost Modifying Factors: Other treatment(s) tried include:various local medications plus Levaquin Associated Signs and Symptoms: Wound has not had a purlulent drainage HPI Description: spry 81 year old gentleman who had a lacerated wound to his left forearm 17 days ago. He tried various local remedies including anti-biotic  ointment, over-the-counter solutions and was recently seen by his PCP who noted cellulitis and put him on Levaquin. His past medical history includes anemia, B12 deficiency, BPH, hemorrhoids. He quit smoking in 1966 06/14/2015 -- he has developed some keratosis next to his wound and was concerned about that. Electronic Signature(s) Signed: 06/21/2015 8:41:40 AM By: Christin Fudge MD, FACS Entered By: Christin Fudge on 06/21/2015 08:41:40 Lazar, Earney Hamburg ([id number]) -------------------------------------------------------------------------------- Physical Exam Details Patient Name: [name]. Date of Service: 06/21/2015 8:30 AM Medical Record Number: [id number] Patient Account Number: [id number] Date of Birth/Sex: [birth year] (81 y.o. Male) Treating RN: Cornell Barman Primary Care Physician: Fulton Reek Other Clinician: Referring Physician: Fulton Reek Treating Physician/Extender: Frann Rider in Treatment: 3 Constitutional . Pulse regular. Respirations normal and unlabored. Afebrile. . Eyes Nonicteric. Reactive to light. Ears, Nose, Mouth, and Throat Lips, teeth, and gums WNL.Marland Kitchen Moist mucosa without lesions . Neck supple and nontender. No palpable supraclavicular or cervical adenopathy. Normal sized without goiter. Respiratory WNL. No retractions.. Cardiovascular Pedal Pulses WNL. No clubbing, cyanosis or edema. Chest Breasts symmetical and no nipple discharge.. Breast tissue WNL, no masses, lumps, or tenderness.. Lymphatic No adneopathy. No adenopathy. No adenopathy. Musculoskeletal Adexa without tenderness or enlargement.. Digits and nails w/o clubbing, cyanosis, infection, petechiae, ischemia, or inflammatory conditions.. Integumentary (Hair, Skin) No suspicious lesions. No crepitus or fluctuance. No peri-wound warmth or erythema. No masses.Marland Kitchen Psychiatric Judgement and insight Intact.. No evidence of depression, anxiety, or agitation.. Notes the wound has closed  down nicely and the keratosis surrounding it is minimal. Electronic Signature(s) Signed: 06/21/2015 8:42:05 AM By: Christin Fudge MD, FACS Entered By: Christin Fudge on 06/21/2015 08:42:05 Nardo, Earney Hamburg ([id number]) -------------------------------------------------------------------------------- Physician Orders Details Patient Name: [name]. Date of Service: 06/21/2015 8:30 AM Medical Record Number: [id number] Patient Account Number: [id number] Date of Birth/Sex: 12-05-1930 (81 y.o. Male) Treating RN: Baruch Gouty, RN, BSN, Velva Harman Primary Care Physician: Fulton Reek Other Clinician: Referring Physician: Fulton Reek  Treating Physician/Extender: Frann Rider in Treatment: 3 Verbal / Phone Orders: Yes Clinician: Afful, RN, BSN, Velva Harman Read Back and Verified: Yes Diagnosis Coding Discharge From [facility] Services o Discharge from [place] Completed Electronic Signature(s) Signed: 06/21/2015 8:33:30 AM By: Regan Lemming BSN, RN Signed: 06/21/2015 4:43:21 PM By: Christin Fudge MD, FACS Entered By: Regan Lemming on 06/21/2015 08:33:30 Bockrath, Earney Hamburg ([id number]) -------------------------------------------------------------------------------- Problem List Details Patient Name: [name]. Date of Service: 06/21/2015 8:30 AM Medical Record Number: [id number] Patient Account Number: [id number] Date of Birth/Sex: [birth year] (81 y.o. Male) Treating RN: Cornell Barman Primary Care Physician: Fulton Reek Other Clinician: Referring Physician: Fulton Reek Treating Physician/Extender: Frann Rider in Treatment: 3 Active Problems ICD-10 Encounter Code Description Active Date Diagnosis [phone] Laceration without foreign body of left forearm, initial 05/31/2015 Yes encounter Inactive Problems Resolved Problems Electronic Signature(s) Signed: 06/21/2015 8:41:31 AM By: Christin Fudge MD, FACS Entered By: Christin Fudge on 06/21/2015 08:41:31 [place]ra, Earney Hamburg  ([id number]) -------------------------------------------------------------------------------- Progress Note Details Patient Name: [name]. Date of Service: 06/21/2015 8:30 AM Medical Record Number: [id number] Patient Account Number: [id number] Date of Birth/Sex: [birth year] (81 y.o. Male) Treating RN: Cornell Barman Primary Care Physician: Fulton Reek Other Clinician: Referring Physician: Fulton Reek Treating Physician/Extender: Frann Rider in Treatment: 3 Subjective Chief Complaint Information obtained from Patient Patient presents to the wound care center for a consult due non healing wound. 81 year old gentleman who had a lacerated wound to his left forearm about 17 days ago. History of Present Illness (HPI) The following HPI elements were documented for the patient's wound: Location: left forearm laceration Quality: Patient reports No Pain. Severity: Patient states wound are getting worse. Duration: Patient has had the wound for < 3 weeks prior to presenting for treatment Context: The wound occurred when the patient had a laceration at home against a bedpost Modifying Factors: Other treatment(s) tried include:various local medications plus Levaquin Associated Signs and Symptoms: Wound has not had a purlulent drainage spry 81 year old gentleman who had a lacerated wound to his left forearm 17 days ago. He tried various local remedies including anti-biotic ointment, over-the-counter solutions and was recently seen by his PCP who noted cellulitis and put him on Levaquin. His past medical history includes anemia, B12 deficiency, BPH, hemorrhoids. He quit smoking in 1966 06/14/2015 -- he has developed some keratosis next to his wound and was concerned about that. Objective Constitutional Pulse regular. Respirations normal and unlabored. Afebrile. Vitals Time Taken: 8:25 AM, Height: 69 in, Weight: 180 lbs, BMI: 26.6, Temperature: 98.1 F, Pulse: 80 bpm, Respiratory  Rate: 17 breaths/min, Blood Pressure: 125/92 mmHg. Eyes Nonicteric. Reactive to light. Brashears, Brenyn K. ([id number]) Ears, Nose, Mouth, and Throat Lips, teeth, and gums WNL.Marland Kitchen Moist mucosa without lesions . Neck supple and nontender. No palpable supraclavicular or cervical adenopathy. Normal sized without goiter. Respiratory WNL. No retractions.. Cardiovascular Pedal Pulses WNL. No clubbing, cyanosis or edema. Chest Breasts symmetical and no nipple discharge.. Breast tissue WNL, no masses, lumps, or tenderness.. Lymphatic No adneopathy. No adenopathy. No adenopathy. Musculoskeletal Adexa without tenderness or enlargement.. Digits and nails w/o clubbing, cyanosis, infection, petechiae, ischemia, or inflammatory conditions.Marland Kitchen Psychiatric Judgement and insight Intact.. No evidence of depression, anxiety, or agitation.. General Notes: the wound has closed down nicely and the keratosis surrounding it is minimal. Integumentary (Hair, Skin) No suspicious lesions. No crepitus or fluctuance. No peri-wound warmth or erythema. No masses.. Wound #1 status is Healed - Epithelialized. Original cause of wound was Gradually Appeared. The wound  is located on the Left,Anterior Forearm. The wound measures 0cm length x 0cm width x 0cm depth; 0cm^2 area and 0cm^3 volume. The wound is limited to skin breakdown. There is no tunneling or undermining noted. There is a none present amount of drainage noted. The wound margin is distinct with the outline attached to the wound base. There is large (67-100%) pink granulation within the wound bed. There is no necrotic tissue within the wound bed. The periwound skin appearance exhibited: Scarring. The periwound skin appearance did not exhibit: Callus, Crepitus, Excoriation, Fluctuance, Friable, Induration, Localized Edema, Rash, Dry/Scaly, Maceration, Moist, Atrophie Blanche, Cyanosis, Ecchymosis, Hemosiderin Staining, Mottled, Pallor, Rubor, Erythema. Periwound  temperature was noted as No Abnormality. Assessment Active Problems ICD-10 ARTEMIO, DOBIE ([id number]) [phone] - Laceration without foreign body of left forearm, initial encounter The wound is completely healed except for some mild keratosis at the edge of the wound. I have asked him to clean this with soap and water pat it dry and apply a Band-Aid. He is discharged from the wound care services and be seen back as needed. Plan Discharge From [facility] Services: Discharge from [place] Completed The wound is completely healed except for some mild keratosis at the edge of the wound. I have asked him to clean this with soap and water pat it dry and apply a Band-Aid. He is discharged from the wound care services and be seen back as needed. Electronic Signature(s) Signed: 06/21/2015 8:42:50 AM By: Christin Fudge MD, FACS Entered By: Christin Fudge on 06/21/2015 08:42:50 Sievers, Earney Hamburg ([id number]) -------------------------------------------------------------------------------- SuperBill Details Patient Name: [name]. Date of Service: 06/21/2015 Medical Record Number: [id number] Patient Account Number: [id number] Date of Birth/Sex: 08/31/1931 (81 y.o. Male) Treating RN: Cornell Barman Primary Care Physician: Fulton Reek Other Clinician: Referring Physician: Fulton Reek Treating Physician/Extender: Frann Rider in Treatment: 3 Diagnosis Coding ICD-10 Codes Code Description [phone] Laceration without foreign body of left forearm, initial encounter Facility Procedures CPT4 Code: 16606004 Description: [phone] - WOUND CARE VISIT-LEV 2 EST PT Modifier: Quantity: 1 Physician Procedures CPT4 Code Description: [id number] - WC PHYS LEVEL 3 - EST PT ICD-10 Description Diagnosis S51.812A Laceration without foreign body of left forearm, init Modifier: ial encounter Quantity: 1 Electronic Signature(s) Signed: 06/21/2015 8:43:02 AM By: Christin Fudge MD, FACS Entered  By: Christin Fudge on 06/21/2015 08:43:02

## 2015-09-27 DIAGNOSIS — N529 Male erectile dysfunction, unspecified: Secondary | ICD-10-CM | POA: Diagnosis not present

## 2015-09-27 DIAGNOSIS — R05 Cough: Secondary | ICD-10-CM | POA: Diagnosis not present

## 2015-09-27 DIAGNOSIS — M5136 Other intervertebral disc degeneration, lumbar region: Secondary | ICD-10-CM | POA: Diagnosis not present

## 2015-09-27 DIAGNOSIS — L989 Disorder of the skin and subcutaneous tissue, unspecified: Secondary | ICD-10-CM | POA: Diagnosis not present

## 2015-09-27 DIAGNOSIS — Z125 Encounter for screening for malignant neoplasm of prostate: Secondary | ICD-10-CM | POA: Diagnosis not present

## 2015-09-27 DIAGNOSIS — E781 Pure hyperglyceridemia: Secondary | ICD-10-CM | POA: Diagnosis not present

## 2015-09-27 DIAGNOSIS — M1A00X0 Idiopathic chronic gout, unspecified site, without tophus (tophi): Secondary | ICD-10-CM | POA: Diagnosis not present

## 2015-09-27 DIAGNOSIS — Z79899 Other long term (current) drug therapy: Secondary | ICD-10-CM | POA: Diagnosis not present

## 2015-10-04 DIAGNOSIS — M5136 Other intervertebral disc degeneration, lumbar region: Secondary | ICD-10-CM | POA: Diagnosis not present

## 2015-10-04 DIAGNOSIS — E781 Pure hyperglyceridemia: Secondary | ICD-10-CM | POA: Diagnosis not present

## 2015-10-04 DIAGNOSIS — Z125 Encounter for screening for malignant neoplasm of prostate: Secondary | ICD-10-CM | POA: Diagnosis not present

## 2015-10-04 DIAGNOSIS — N529 Male erectile dysfunction, unspecified: Secondary | ICD-10-CM | POA: Diagnosis not present

## 2015-10-04 DIAGNOSIS — M1A00X0 Idiopathic chronic gout, unspecified site, without tophus (tophi): Secondary | ICD-10-CM | POA: Diagnosis not present

## 2015-10-04 DIAGNOSIS — R05 Cough: Secondary | ICD-10-CM | POA: Diagnosis not present

## 2015-10-04 DIAGNOSIS — Z79899 Other long term (current) drug therapy: Secondary | ICD-10-CM | POA: Diagnosis not present

## 2015-10-04 DIAGNOSIS — L989 Disorder of the skin and subcutaneous tissue, unspecified: Secondary | ICD-10-CM | POA: Diagnosis not present

## 2015-10-20 DIAGNOSIS — L821 Other seborrheic keratosis: Secondary | ICD-10-CM | POA: Diagnosis not present

## 2015-10-20 DIAGNOSIS — L538 Other specified erythematous conditions: Secondary | ICD-10-CM | POA: Diagnosis not present

## 2015-10-20 DIAGNOSIS — L820 Inflamed seborrheic keratosis: Secondary | ICD-10-CM | POA: Diagnosis not present

## 2015-10-20 DIAGNOSIS — L298 Other pruritus: Secondary | ICD-10-CM | POA: Diagnosis not present

## 2015-10-20 DIAGNOSIS — L570 Actinic keratosis: Secondary | ICD-10-CM | POA: Diagnosis not present

## 2015-10-20 DIAGNOSIS — X32XXXA Exposure to sunlight, initial encounter: Secondary | ICD-10-CM | POA: Diagnosis not present

## 2016-02-01 ENCOUNTER — Observation Stay
Admission: EM | Admit: 2016-02-01 | Discharge: 2016-02-02 | Disposition: A | Discharge status: Home / Self Care | Payer: Commercial Managed Care - HMO | Attending: Internal Medicine | Admitting: Internal Medicine

## 2016-02-01 ENCOUNTER — Emergency Department: Payer: Commercial Managed Care - HMO

## 2016-02-01 ENCOUNTER — Encounter: Payer: Self-pay | Admitting: Emergency Medicine

## 2016-02-01 DIAGNOSIS — R197 Diarrhea, unspecified: Secondary | ICD-10-CM | POA: Insufficient documentation

## 2016-02-01 DIAGNOSIS — R42 Dizziness and giddiness: Principal | ICD-10-CM | POA: Insufficient documentation

## 2016-02-01 DIAGNOSIS — R1110 Vomiting, unspecified: Secondary | ICD-10-CM | POA: Diagnosis present

## 2016-02-01 DIAGNOSIS — Z8249 Family history of ischemic heart disease and other diseases of the circulatory system: Secondary | ICD-10-CM | POA: Diagnosis not present

## 2016-02-01 DIAGNOSIS — Z79899 Other long term (current) drug therapy: Secondary | ICD-10-CM | POA: Diagnosis not present

## 2016-02-01 DIAGNOSIS — R112 Nausea with vomiting, unspecified: Secondary | ICD-10-CM | POA: Insufficient documentation

## 2016-02-01 DIAGNOSIS — Z886 Allergy status to analgesic agent status: Secondary | ICD-10-CM | POA: Diagnosis not present

## 2016-02-01 DIAGNOSIS — R531 Weakness: Secondary | ICD-10-CM | POA: Diagnosis not present

## 2016-02-01 LAB — URINALYSIS COMPLETE WITH MICROSCOPIC (ARMC ONLY)
BACTERIA UA: NONE SEEN
Bilirubin Urine: NEGATIVE
Glucose, UA: NEGATIVE mg/dL
HGB URINE DIPSTICK: NEGATIVE
Leukocytes, UA: NEGATIVE
Nitrite: NEGATIVE
PROTEIN: 30 mg/dL — AB
SPECIFIC GRAVITY, URINE: 1.019 (ref 1.005–1.030)
pH: 5 (ref 5.0–8.0)

## 2016-02-01 LAB — COMPREHENSIVE METABOLIC PANEL
ALT: 23 U/L (ref 17–63)
ANION GAP: 11 (ref 5–15)
AST: 25 U/L (ref 15–41)
Albumin: 4.4 g/dL (ref 3.5–5.0)
Alkaline Phosphatase: 59 U/L (ref 38–126)
BUN: 14 mg/dL (ref 6–20)
CHLORIDE: 102 mmol/L (ref 101–111)
CO2: 24 mmol/L (ref 22–32)
Calcium: 9 mg/dL (ref 8.9–10.3)
Creatinine, Ser: 0.9 mg/dL (ref 0.61–1.24)
Glucose, Bld: 179 mg/dL — ABNORMAL HIGH (ref 65–99)
POTASSIUM: 3.8 mmol/L (ref 3.5–5.1)
SODIUM: 137 mmol/L (ref 135–145)
Total Bilirubin: 1 mg/dL (ref 0.3–1.2)
Total Protein: 7.4 g/dL (ref 6.5–8.1)

## 2016-02-01 LAB — CBC
HCT: 43.4 % (ref 40.0–52.0)
Hemoglobin: 14.6 g/dL (ref 13.0–18.0)
MCH: 30.2 pg (ref 26.0–34.0)
MCHC: 33.6 g/dL (ref 32.0–36.0)
MCV: 89.9 fL (ref 80.0–100.0)
PLATELETS: 210 10*3/uL (ref 150–440)
RBC: 4.83 MIL/uL (ref 4.40–5.90)
RDW: 12.7 % (ref 11.5–14.5)
WBC: 13.8 10*3/uL — ABNORMAL HIGH (ref 3.8–10.6)

## 2016-02-01 LAB — LIPASE, BLOOD: Lipase: 27 U/L (ref 11–51)

## 2016-02-01 LAB — TROPONIN I

## 2016-02-01 MED ORDER — METOCLOPRAMIDE HCL 5 MG/ML IJ SOLN
10.0000 mg | Freq: Once | INTRAMUSCULAR | Status: AC
  Administered 2016-02-01: 10 mg via INTRAVENOUS
  Filled 2016-02-01: qty 2

## 2016-02-01 MED ORDER — MECLIZINE HCL 25 MG PO TABS
25.0000 mg | ORAL_TABLET | Freq: Three times a day (TID) | ORAL | Status: DC
  Administered 2016-02-01 – 2016-02-02 (×2): 25 mg via ORAL
  Filled 2016-02-01 (×2): qty 1

## 2016-02-01 MED ORDER — ONDANSETRON HCL 4 MG PO TABS: 4.0000 mg | ORAL_TABLET | Freq: Four times a day (QID) | ORAL | Status: DC | PRN

## 2016-02-01 MED ORDER — PROSIGHT PO TABS
1.0000 | ORAL_TABLET | Freq: Every day | ORAL | Status: DC
  Administered 2016-02-02: 1 via ORAL
  Filled 2016-02-01 (×2): qty 1

## 2016-02-01 MED ORDER — ENOXAPARIN SODIUM 40 MG/0.4ML ~~LOC~~ SOLN
40.0000 mg | SUBCUTANEOUS | Status: DC
  Administered 2016-02-01: 40 mg via SUBCUTANEOUS
  Filled 2016-02-01: qty 0.4

## 2016-02-01 MED ORDER — SODIUM CHLORIDE 0.9 % IV SOLN
Freq: Once | INTRAVENOUS | Status: AC
  Administered 2016-02-01: via INTRAVENOUS

## 2016-02-01 MED ORDER — SODIUM CHLORIDE 0.9 % IV BOLUS (SEPSIS)
1000.0000 mL | Freq: Once | INTRAVENOUS | Status: AC
  Administered 2016-02-01: 1000 mL via INTRAVENOUS

## 2016-02-01 MED ORDER — CALCIUM CARBONATE ANTACID 500 MG PO CHEW
500.0000 mg | CHEWABLE_TABLET | Freq: Every day | ORAL | Status: DC
  Administered 2016-02-02: 11:00:00 500 mg via ORAL
  Filled 2016-02-01: qty 1

## 2016-02-01 MED ORDER — ONDANSETRON HCL 4 MG/2ML IJ SOLN
4.0000 mg | Freq: Four times a day (QID) | INTRAMUSCULAR | Status: DC | PRN
  Administered 2016-02-02: 4 mg via INTRAVENOUS
  Filled 2016-02-01: qty 2

## 2016-02-01 MED ORDER — ACETAMINOPHEN 650 MG RE SUPP
650.0000 mg | Freq: Four times a day (QID) | RECTAL | Status: DC | PRN
Start: 2016-02-01 — End: 2016-02-02

## 2016-02-01 MED ORDER — ACETAMINOPHEN 325 MG PO TABS: 650.0000 mg | ORAL_TABLET | Freq: Four times a day (QID) | ORAL | Status: DC | PRN

## 2016-02-01 MED ORDER — BACITRACIN ZINC 500 UNIT/GM EX OINT
TOPICAL_OINTMENT | CUTANEOUS | Status: AC
Start: 2016-02-01 — End: 2016-02-02
  Filled 2016-02-01: qty 0.9

## 2016-02-01 MED ORDER — ONDANSETRON HCL 4 MG/2ML IJ SOLN
4.0000 mg | Freq: Once | INTRAMUSCULAR | Status: AC
  Administered 2016-02-01: 4 mg via INTRAVENOUS

## 2016-02-01 MED ORDER — ONDANSETRON HCL 4 MG/2ML IJ SOLN
INTRAMUSCULAR | Status: AC
  Administered 2016-02-01: 4 mg via INTRAVENOUS
  Filled 2016-02-01: qty 2

## 2016-02-01 NOTE — ED Notes (Signed)
Pt. States when he moves his head from rt. To lt. Pt. Becomes nauseated, reported to Dr. Mamie Nick.

## 2016-02-01 NOTE — ED Notes (Signed)
Applied bacitracin and band-aid to pt. Rt. 3rd finger, for minor skin tear.

## 2016-02-01 NOTE — ED Provider Notes (Signed)
[facility] Emergency Department Provider Note  Time seen: 6:53 PM  I have reviewed the triage vital signs and the nursing notes.   HISTORY  Chief Complaint Dizziness; Nausea; Emesis; and Diarrhea    HPI [name] is a 82 y.o. male who presents to the emergency department with sudden onset of nausea, vomiting, diarrhea, lightheadedness and diaphoresis. According to the patient he states he ate some ice cream around 3:30 PM and then took a shower. He states around 5 PM he suddenly began feeling unwell which she describes as a lightheaded sensation, dizzy, became diaphoretic and felt like he had have a bowel movement. Patient had 2-3 episodes of diarrhea, became nauseated and had several episodes of vomiting. Patient continues to feel unwell, which she describes as a lightheaded sensation and nausea. Denies any chest pain or shortness of breath at any time. Denies any abdominal pain. Denies any black or bloody stool or vomit.Denies fever.     History reviewed. No pertinent past medical history.  There are no active problems to display for this patient.   History reviewed. No pertinent past surgical history.  No current outpatient prescriptions on file.  Allergies Novocain  No family history on file.  Social History Social History  Substance Use Topics  . Smoking status: Never Smoker   . Smokeless tobacco: None  . Alcohol Use: No    Review of Systems Constitutional: Negative for fever. Cardiovascular: Negative for chest pain. Respiratory: Negative for shortness of breath. Gastrointestinal: Negative for abdominal pain. Positive for nausea, vomiting, 2 episodes of diarrhea. Genitourinary: Negative for dysuria. Musculoskeletal: Negative for back pain. Neurological: Negative for headache 10-point ROS otherwise negative.  ____________________________________________   PHYSICAL EXAM:  VITAL SIGNS: ED Triage Vitals  Enc Vitals Group     BP  02/01/16 1846 150/77 mmHg     Pulse Rate 02/01/16 1846 86     Resp 02/01/16 1846 18     Temp 02/01/16 1846 97.9 F (36.6 C)     Temp Source 02/01/16 1846 Oral     SpO2 02/01/16 1846 100 %     Weight 02/01/16 1846 185 lb (83.915 kg)     Height 02/01/16 1846 5\' 10"  (1.778 m)     Head Cir --      Peak Flow --      Pain Score 02/01/16 1847 0     Pain Loc --      Pain Edu? --      Excl. in [place]? --     Constitutional: Alert and oriented. Well appearing and in no distress. Eyes: Normal exam, No nystagmus. ENT   Head: Normocephalic and atraumatic.   Mouth/Throat: Mucous membranes are moist. Cardiovascular: Normal rate, regular rhythm. No murmur Respiratory: Normal respiratory effort without tachypnea nor retractions. Breath sounds are clear  Gastrointestinal: Soft and nontender. No distention. Musculoskeletal: Nontender with normal range of motion in all extremities.  Neurologic:  Normal speech and language. No gross focal neurologic deficits. Cranial nerves intact. Equal grip strengths bilaterally. Skin:  Skin is warm, dry and intact.  Psychiatric: Mood and affect are normal. Speech and behavior are normal.  ____________________________________________    EKG  EKG reviewed and interpreted by myself shows sinus rhythm at 88 bpm, narrow QRS, normal axis, normal intervals, nonspecific but no concerning ST changes noted.  ____________________________________________   INITIAL IMPRESSION / ASSESSMENT AND PLAN / ED COURSE  Pertinent labs & imaging results that were available during my care of the patient were  reviewed by me and considered in my medical decision making (see chart for details).  The patient presents the emergency department with sudden onset of nausea, vomiting, diarrhea, diaphoresis and generalized weakness. Differential diagnosis at this time would include cardiac arrhythmia, vagal episode, gastroenteritis, vertigo. We will obtain labs, IV hydrate, treat the  patient's nausea and closely monitor in the emergency department.  Patient continues to feel nauseated. Labs are within normal limits. CT head negative. Patient had received 2 doses of Zofran and now one dose of Reglan. I highly suspect vertigo, as the symptoms and only curved the patient turns his head. Given his continued nausea and vomiting we will admit to the hospital for further treatment and workup.  ____________________________________________   FINAL CLINICAL IMPRESSION(S) / ED DIAGNOSES  Nausea vomiting diarrhea Generalized weakness   Harvest Dark, MD 02/01/16 2154

## 2016-02-01 NOTE — ED Notes (Signed)
Pt. Helped to use urinal.  Pt. Vomited, pt. Given 10 mg reglan.

## 2016-02-01 NOTE — H&P (Signed)
[place] at [place] NAME: [name]    MR#:  [id number]  DATE OF BIRTH:  1931-01-31  DATE OF ADMISSION:  02/01/2016  PRIMARY CARE PHYSICIAN: SPARKS,JEFFREY D, MD   REQUESTING/REFERRING PHYSICIAN: dr Vicente Males  CHIEF COMPLAINT:  Dizziness and difficulty with balance nausea and vomiting today  HISTORY OF PRESENT ILLNESS:  [name]  is a 82 y.o. male with No past medical history comes to the emergency room with acute onset of dizziness, nausea vomiting which is been intractable and couple episodes of diarrhea Patient reports he ate some ice cream around 3:00 went to take shower once he got out of the shower as he was trying to put on his PJs he became very dizzy lightheaded and broke out in a sweat and had diarrheal episodes and thereafter having intractable nausea vomiting. He is having difficulty balancing and feels dizzy and reported things spinning around him. His been having heaviness in his ears along with some ringing in his ears. Denies any ear discharge fever. In the emergency room patient was unable to sit up due to dizziness he had several episodes of vomiting in the emergency room CT of the head is negative for CVA Patient is being admitted for further workup for Vertigo.  PAST MEDICAL HISTORY:  History reviewed. No pertinent past medical history.  PAST SURGICAL HISTOIRY:  History reviewed. No pertinent past surgical history.  SOCIAL HISTORY:   Social History  Substance Use Topics  . Smoking status: Never Smoker   . Smokeless tobacco: Not on file  . Alcohol Use: No    FAMILY HISTORY:  htn  DRUG ALLERGIES:   Allergies  Allergen Reactions  . Novocain [Procaine]     REVIEW OF SYSTEMS:  Review of Systems  Constitutional: Negative for fever, chills and weight loss.  HENT: Negative for ear discharge, ear pain and nosebleeds.   Eyes: Negative for blurred vision, pain and discharge.  Respiratory: Negative for  sputum production, shortness of breath, wheezing and stridor.   Cardiovascular: Negative for chest pain, palpitations, orthopnea and PND.  Gastrointestinal: Positive for nausea and vomiting. Negative for abdominal pain and diarrhea.  Genitourinary: Negative for urgency and frequency.  Musculoskeletal: Negative for back pain and joint pain.  Neurological: Positive for dizziness and weakness. Negative for sensory change, speech change and focal weakness.  Psychiatric/Behavioral: Negative for depression and hallucinations. The patient is not nervous/anxious.   All other systems reviewed and are negative.    MEDICATIONS AT HOME:   Prior to Admission medications   Medication Sig Start Date End Date Taking? Authorizing Provider  calcium carbonate (OS-CAL - DOSED IN MG OF ELEMENTAL CALCIUM) 1250 (500 Ca) MG tablet Take 1 tablet by mouth daily.   Yes Historical Provider, MD  Multiple Vitamins-Minerals (PRESERVISION AREDS 2 PO) Take 1 capsule by mouth daily.   Yes Historical Provider, MD      VITAL SIGNS:  Blood pressure 150/77, pulse 86, temperature 97.9 F (36.6 C), temperature source Oral, resp. rate 18, height 5\' 10"  (1.778 m), weight 83.915 kg (185 lb), SpO2 100 %.  PHYSICAL EXAMINATION:  GENERAL:  82 y.o.-year-old patient lying in the bed with no acute distress.  EYES: Pupils equal, round, reactive to light and accommodation. No scleral icterus. Extraocular muscles intact.  HEENT: Head atraumatic, normocephalic. Oropharynx and nasopharynx clear. No nystagmus NECK:  [name], no jugular venous distention. No thyroid enlargement, no tenderness.  LUNGS: Normal breath sounds bilaterally, no wheezing, rales,rhonchi or crepitation.  No use of accessory muscles of respiration.  CARDIOVASCULAR: S1, S2 normal. No murmurs, rubs, or gallops.  ABDOMEN: Soft, nontender, nondistended. Bowel sounds present. No organomegaly or mass.  EXTREMITIES: No pedal edema, cyanosis, or clubbing.  NEUROLOGIC: Cranial  nerves II through XII are intact. Muscle strength 5/5 in all extremities. Sensation intact. Gait not checked.  PSYCHIATRIC: The patient is alert and oriented x 3.  SKIN: No obvious rash, lesion, or ulcer.   LABORATORY PANEL:   CBC  Recent Labs Lab 02/01/16 1852  WBC 13.8*  HGB 14.6  HCT 43.4  PLT 210   ------------------------------------------------------------------------------------------------------------------  Chemistries   Recent Labs Lab 02/01/16 1852  NA 137  K 3.8  CL 102  CO2 24  GLUCOSE 179*  BUN 14  CREATININE 0.90  CALCIUM 9.0  AST 25  ALT 23  ALKPHOS 59  BILITOT 1.0   ------------------------------------------------------------------------------------------------------------------  Cardiac Enzymes  Recent Labs Lab 02/01/16 1852  TROPONINI <0.03   ------------------------------------------------------------------------------------------------------------------  RADIOLOGY:  Ct Head Wo Contrast  02/01/2016  CLINICAL DATA:  Dizziness with episode of vomiting and diarrhea. EXAM: CT HEAD WITHOUT CONTRAST TECHNIQUE: Contiguous axial images were obtained from the base of the skull through the vertex without intravenous contrast. COMPARISON:  MRI 07/17/2013 FINDINGS: Ventricles, cisterns and other CSF spaces are within normal. There is mild age related atrophic change and minimal chronic ischemic microvascular disease. There is no mass, mass effect, shift of midline structures or acute hemorrhage. No evidence of acute infarction. Remaining bony structures and soft tissues are within normal. IMPRESSION: No acute intracranial findings. Age related atrophic change and mild chronic ischemic microvascular disease. Electronically Signed   By: Marin Olp M.D.   On: 02/01/2016 21:03   IMPRESSION AND PLAN:  [name]  is a 82 y.o. male with No past medical history comes to the emergency room with acute onset of dizziness, nausea vomiting which is been intractable  and couple episodes of diarrhea Patient reports he ate some ice cream around 3:00 went to take shower once he got out of the shower as he was trying to put on his PJs he became very dizzy lightheaded and broke out in a sweat and had diarrheal episodes and thereafter having intractable nausea vomiting  1. acute onset of nausea vomiting and severe dizziness suggestive of acute vertigo appears benign positional however cannot rule out central cause -CT head negative for stroke -Meclizine 3 times a day -IV fluids -MRI of the brain -Physical therapy -ENT outpatient follow-up  2. DVT prophylaxis subcutaneous Lovenox  3. Intractable nausea vomiting -When necessary Zofran and IV fluids     All the records are reviewed and case discussed with ED provider. Management plans discussed with the patient, family and they are in agreement.  CODE STATUS: Full  TOTAL TIME TAKING CARE OF THIS PATIENT 45 minutes.    [name] M.D on 02/01/2016 at 10:52 PM  Between 7am to 6pm - Pager - [phone]  After 6pm go to [email or web address] - password [email or web address] [facility]  Office  [phone]  CC: Primary care physician; Idelle Crouch, MD

## 2016-02-01 NOTE — ED Notes (Signed)
Pt. Vomited while giving urine sample.  Order put in for 4 mg of zofran.

## 2016-02-01 NOTE — ED Notes (Signed)
States patient had eaten some ice cream and then took a shower.  Felt well until after shower when patient became dizzy.  Patient went into the bathroom and vomited once and had a episode of diarrhea.  Patient denies c/o pain, just c/o feeling dizzy.

## 2016-02-02 ENCOUNTER — Observation Stay: Payer: Commercial Managed Care - HMO

## 2016-02-02 DIAGNOSIS — R112 Nausea with vomiting, unspecified: Secondary | ICD-10-CM | POA: Diagnosis not present

## 2016-02-02 DIAGNOSIS — R42 Dizziness and giddiness: Secondary | ICD-10-CM | POA: Diagnosis not present

## 2016-02-02 MED ORDER — MECLIZINE HCL 25 MG PO TABS: 25.0000 mg | ORAL_TABLET | Freq: Three times a day (TID) | ORAL | Status: DC | PRN

## 2016-02-02 MED ORDER — PROCHLORPERAZINE EDISYLATE 5 MG/ML IJ SOLN
10.0000 mg | Freq: Once | INTRAMUSCULAR | Status: AC
  Administered 2016-02-02: 07:00:00 10 mg via INTRAVENOUS
  Filled 2016-02-02: qty 2

## 2016-02-02 NOTE — Discharge Instructions (Signed)
Heart healthy diet. °Activity as tolerated. °Fall precaution. °

## 2016-02-02 NOTE — Care Management Obs Status (Signed)
[place] NOTIFICATION   Patient Details  Name: [name] MRN: [id number] Date of Birth: 05-Aug-1931   Medicare Observation Status Notification Given:  Yes    Shelbie Ammons, RN 02/02/2016, 12:13 PM

## 2016-02-02 NOTE — Plan of Care (Signed)
Problem: Safety: Goal: Ability to remain free from injury will improve Outcome: Progressing Less dizziness.  Med for this to be sent with pt as presc for prn use.

## 2016-02-02 NOTE — Discharge Summary (Signed)
[place] at [place] NAME: [name]    MR#:  [id number]  DATE OF BIRTH:  Dec 12, 1930  DATE OF ADMISSION:  02/01/2016 ADMITTING PHYSICIAN: Fritzi Mandes, MD  DATE OF DISCHARGE: 02/02/2016  1:51 PM  PRIMARY CARE PHYSICIAN: SPARKS,JEFFREY D, MD    ADMISSION DIAGNOSIS:  Dizziness [R42] Intractable vomiting with nausea, vomiting of unspecified type [R11.10]   DISCHARGE DIAGNOSIS:   acute vertigo SECONDARY DIAGNOSIS:  History reviewed. No pertinent past medical history.  HOSPITAL COURSE:  [name] is a 82 y.o. male with No past medical history comes to the emergency room with acute onset of dizziness, nausea vomiting which is been intractable and couple episodes of diarrhea Patient reports he ate some ice cream around 3:00 went to take shower once he got out of the shower as he was trying to put on his PJs he became very dizzy lightheaded and broke out in a sweat and had diarrheal episodes and thereafter having intractable nausea vomiting  1. acute onset of nausea vomiting and severe dizziness suggestive of acute vertigo. -CT head negative for stroke -Meclizine 3 times a day -IV fluids -MRI of the brain: no CVA. ENT outpatient follow-up  2. DVT prophylaxis subcutaneous Lovenox  3. Intractable nausea vomiting, improved. Treated with prn Zofran and IV fluids  DISCHARGE CONDITIONS:   Stable, discharged to home today.  CONSULTS OBTAINED:     DRUG ALLERGIES:   Allergies  Allergen Reactions  . Novocain [Procaine]     DISCHARGE MEDICATIONS:   Discharge Medication List as of 02/02/2016 12:48 PM    START taking these medications   Details  meclizine (ANTIVERT) 25 MG tablet Take 1 tablet (25 mg total) by mouth 3 (three) times daily as needed for dizziness., Starting 02/02/2016, Until Discontinued, Print      CONTINUE these medications which have NOT CHANGED   Details  calcium carbonate (OS-CAL - DOSED IN MG OF ELEMENTAL  CALCIUM) 1250 (500 Ca) MG tablet Take 1 tablet by mouth daily., Until Discontinued, Historical Med    Multiple Vitamins-Minerals (PRESERVISION AREDS 2 PO) Take 1 capsule by mouth daily., Until Discontinued, Historical Med         DISCHARGE INSTRUCTIONS:   If you experience worsening of your admission symptoms, develop shortness of breath, life threatening emergency, suicidal or homicidal thoughts you must seek medical attention immediately by calling 911 or calling your MD immediately  if symptoms less severe.  You Must read complete instructions/literature along with all the possible adverse reactions/side effects for all the Medicines you take and that have been prescribed to you. Take any new Medicines after you have completely understood and accept all the possible adverse reactions/side effects.   Please note  You were cared for by a hospitalist during your hospital stay. If you have any questions about your discharge medications or the care you received while you were in the hospital after you are discharged, you can call the unit and asked to speak with the hospitalist on call if the hospitalist that took care of you is not available. Once you are discharged, your primary care physician will handle any further medical issues. Please note that NO REFILLS for any discharge medications will be authorized once you are discharged, as it is imperative that you return to your primary care physician (or establish a relationship with a primary care physician if you do not have one) for your aftercare needs so that they can reassess your need for  medications and monitor your lab values.    Today   SUBJECTIVE   Better dizziness. No nausea or vomiting.  VITAL SIGNS:  Blood pressure 129/66, pulse 87, temperature 97.8 F (36.6 C), temperature source Oral, resp. rate 20, height 5\' 10"  (1.778 m), weight 185 lb 11.2 oz (84.233 kg), SpO2 95 %.  I/O:   Intake/Output Summary (Last 24 hours) at  02/02/16 1554 Last data filed at 02/02/16 0900  Gross per 24 hour  Intake 823.75 ml  Output      0 ml  Net 823.75 ml    PHYSICAL EXAMINATION:  GENERAL:  82 y.o.-year-old patient lying in the bed with no acute distress.  EYES: Pupils equal, round, reactive to light and accommodation. No scleral icterus. Extraocular muscles intact.  HEENT: Head atraumatic, normocephalic. Oropharynx and nasopharynx clear.  NECK:  Supple, no jugular venous distention. No thyroid enlargement, no tenderness.  LUNGS: Normal breath sounds bilaterally, no wheezing, rales,rhonchi or crepitation. No use of accessory muscles of respiration.  CARDIOVASCULAR: S1, S2 normal. No murmurs, rubs, or gallops.  ABDOMEN: Soft, non-tender, non-distended. Bowel sounds present. No organomegaly or mass.  EXTREMITIES: No pedal edema, cyanosis, or clubbing.  NEUROLOGIC: Cranial nerves II through XII are intact. Muscle strength 5/5 in all extremities. Sensation intact. Gait not checked.  PSYCHIATRIC: The patient is alert and oriented x 3.  SKIN: No obvious rash, lesion, or ulcer.   DATA REVIEW:   CBC  Recent Labs Lab 02/01/16 1852  WBC 13.8*  HGB 14.6  HCT 43.4  PLT 210    Chemistries   Recent Labs Lab 02/01/16 1852  NA 137  K 3.8  CL 102  CO2 24  GLUCOSE 179*  BUN 14  CREATININE 0.90  CALCIUM 9.0  AST 25  ALT 23  ALKPHOS 59  BILITOT 1.0    Cardiac Enzymes  Recent Labs Lab 02/01/16 1852  TROPONINI <0.03    Microbiology Results  No results found for this or any previous visit.  RADIOLOGY:  Ct Head Wo Contrast  02/01/2016  CLINICAL DATA:  Dizziness with episode of vomiting and diarrhea. EXAM: CT HEAD WITHOUT CONTRAST TECHNIQUE: Contiguous axial images were obtained from the base of the skull through the vertex without intravenous contrast. COMPARISON:  MRI 07/17/2013 FINDINGS: Ventricles, cisterns and other CSF spaces are within normal. There is mild age related atrophic change and minimal chronic  ischemic microvascular disease. There is no mass, mass effect, shift of midline structures or acute hemorrhage. No evidence of acute infarction. Remaining bony structures and soft tissues are within normal. IMPRESSION: No acute intracranial findings. Age related atrophic change and mild chronic ischemic microvascular disease. Electronically Signed   By: Marin Olp M.D.   On: 02/01/2016 21:03   Mr Brain Wo Contrast  02/02/2016  CLINICAL DATA:  Vertigo EXAM: MRI HEAD WITHOUT CONTRAST TECHNIQUE: Multiplanar, multiecho pulse sequences of the brain and surrounding structures were obtained without intravenous contrast. COMPARISON:  CT head 02/01/2016.  MRI 07/17/2013 FINDINGS: Generalized atrophy with mild progression since 2014. Negative for hydrocephalus. Negative for acute infarct. Mild chronic microvascular ischemic change similar to 2014. Chronic microvascular ischemic changes in the white matter and pons. Negative for intracranial hemorrhage or fluid collection Negative for mass or edema.  No shift of the midline structures. Paranasal sinuses clear.  Normal orbit. Normal pituitary and skullbase. IMPRESSION: No acute abnormality. Mild progression of atrophy since 2014. Mild chronic microvascular ischemic changes are stable. Electronically Signed   By: Franchot Gallo M.D.  On: 02/02/2016 09:57        Management plans discussed with the patient, wife, son and daughter, and they are in agreement.  CODE STATUS:     Code Status Orders        Start     Ordered   02/01/16 2327  Full code   Continuous     02/01/16 2326    Code Status History    Date Active Date Inactive Code Status Order ID Comments User Context   This patient has a current code status but no historical code status.    Advance Directive Documentation        Most Recent Value   Type of Advance Directive  Healthcare Power of Attorney   Pre-existing out of facility DNR order (yellow form or pink MOST form)     "MOST" Form in  Place?        TOTAL TIME TAKING CARE OF THIS PATIENT: 26 minutes.    Demetrios Loll M.D on 02/02/2016 at 3:54 PM  Between 7am to 6pm - Pager - [phone]  After 6pm go to [email or web address] - password [email or web address] [facility]  Office  [phone]  CC: Primary care physician; Idelle Crouch, MD

## 2016-02-02 NOTE — Plan of Care (Signed)
Problem: Education: Goal: Knowledge of  General Education information/materials will improve Outcome: Progressing Pt likes to be called [name]  No pertinent past medical history

## 2016-02-02 NOTE — Progress Notes (Signed)
Pt for discharge home. Cont to have some dizziness upon  Standing. Instructed to  Rise slowly  And sit  Awhile before standing.  Hold on  To  Something when ambulating if dizzy.  Voices no c/o pain.  Discharge instructions discussed with pt / wif and pts dtr.  presc given and discussed and home meds discussed. Diet/ activity and f/u discussed.  Verbalizes understanding of all. Home at this time with family via w/c. Sl d/cd.

## 2016-02-02 NOTE — Evaluation (Signed)
Physical Therapy Evaluation Patient Details Name: [name] MRN: [id number] DOB: 1930-11-18 Today's Date: 02/02/2016   History of Present Illness  Pt is an 82 y.o. male presenting with dizziness, nausea, emesis, and diarrhea.  Pt admitted for work-up for acute vertigo.  CT head and MRI of brain negative for acute findings.  Clinical Impression  Prior to admission, pt was independent ambulating without AD.  Pt lives with his wife in 1 level home with stairs to enter.  Currently pt is CGA with transfers and CGA to min assist with ambulation no AD (pt with dizziness causing imbalance but balance improved with cues to not turn head and look around with eyes instead).  Pt demonstrated c/o dizziness/nausea with head turns R/L/up/down (eyes open and closed) but no c/o dizziness/nausea with eye movements R/L/up/down.  No nystagmus noted with activities and assessment.  Pt would benefit from skilled PT to address noted impairments and functional limitations.  Recommend pt discharge to home with 24/7 assist when medically appropriate.  Pt seen in AM and discharge noted in system in PM but pt already discharged home prior to PT being able to return to pt's room to further assess AD needs for ambulation (limited assessment in morning d/t dizziness and nausea).     Follow Up Recommendations Supervision/Assistance - 24 hour (OP PT pending further assessment (CM notified prior to discharge))    Equipment Recommendations   (TBD pending further assessment (limited d/t dizziness/nausea during AM session))    Recommendations for Other Services       Precautions / Restrictions Precautions Precautions: Fall Restrictions Weight Bearing Restrictions: No      Mobility  Bed Mobility Overal bed mobility: Independent             General bed mobility comments: Supine to/from sit.  Transfers Overall transfer level: Needs assistance Equipment used: None Transfers: Sit to/from Stand Sit to Stand: Min  guard         General transfer comment: pt c/o nausea and mild dizziness but no loss of balance  Ambulation/Gait Ambulation/Gait assistance: Min guard;Min assist Ambulation Distance (Feet): 120 Feet Assistive device: None Gait Pattern/deviations: Step-through pattern Gait velocity: decreased   General Gait Details: occasional loss of balance laterally requiring min assist to steady (d/t c/o dizziness) but improved steadiness noted with vc's to keep head midline (not turn) and look around with eyes instead  Stairs            Wheelchair Mobility    Modified Rankin (Stroke Patients Only)       Balance Overall balance assessment: Needs assistance Sitting-balance support: No upper extremity supported;Feet supported Sitting balance-Leahy Scale: Good     Standing balance support: No upper extremity supported Standing balance-Leahy Scale: Good Standing balance comment: static standing                             Pertinent Vitals/Pain Pain Assessment: No/denies pain  Vitals stable and WFL throughout treatment session.    Home Living Family/patient expects to be discharged to:: Private residence Living Arrangements: Spouse/significant other Available Help at Discharge: Family Type of Home: House Home Access: Stairs to enter Entrance Stairs-Rails: Psychiatric nurse of Steps: 6 Home Layout: One level Home Equipment: None      Prior Function Level of Independence: Independent               Hand Dominance        Extremity/Trunk Assessment  Upper Extremity Assessment: Overall WFL for tasks assessed           Lower Extremity Assessment: Overall WFL for tasks assessed  Proprioception, coordination, sensation, and tone intact B LE's    Cervical / Trunk Assessment: Normal  Communication   Communication: No difficulties  Cognition Arousal/Alertness: Awake/alert Behavior During Therapy: WFL for tasks  assessed/performed Overall Cognitive Status: Within Functional Limits for tasks assessed                      General Comments   Pt agreeable to PT session.  Pt's wife and daughter present during session.    Exercises        Assessment/Plan    PT Assessment Patient needs continued PT services  PT Diagnosis Difficulty walking   PT Problem List Decreased balance;Decreased mobility;Decreased knowledge of use of DME  PT Treatment Interventions DME instruction;Gait training;Stair training;Functional mobility training;Therapeutic activities;Therapeutic exercise;Balance training;Patient/family education   PT Goals (Current goals can be found in the Care Plan section) Acute Rehab PT Goals Patient Stated Goal: to go home PT Goal Formulation: With patient Time For Goal Achievement: 02/16/16 Potential to Achieve Goals: Good    Frequency Min 2X/week   Barriers to discharge        Co-evaluation               End of Session Equipment Utilized During Treatment: Gait belt Activity Tolerance:  (Limited d/t dizziness and nausea with activity) Patient left: in bed;with call [place]/phone within reach;with bed alarm set;with family/visitor present Nurse Communication: Mobility status;Precautions    Functional Assessment Tool Used: AM-PAC without stairs Functional Limitation: Mobility: Walking and moving around Mobility: Walking and Moving Around Current Status [id number]): At least 20 percent but less than 40 percent impaired, limited or restricted Mobility: Walking and Moving Around Goal Status [phone]): 0 percent impaired, limited or restricted    Time: [id number] PT Time Calculation (min) (ACUTE ONLY): 24 min   Charges:   PT Evaluation $PT Eval Low Complexity: 1 Procedure     PT G Codes:   PT G-Codes **NOT FOR INPATIENT CLASS** Functional Assessment Tool Used: AM-PAC without stairs Functional Limitation: Mobility: Walking and moving around Mobility: Walking and Moving  Around Current Status [id number]): At least 20 percent but less than 40 percent impaired, limited or restricted Mobility: Walking and Moving Around Goal Status [phone]): 0 percent impaired, limited or restricted    [name] 02/02/2016, 5:16 PM [name], [place]

## 2016-02-23 DIAGNOSIS — H8110 Benign paroxysmal vertigo, unspecified ear: Secondary | ICD-10-CM | POA: Diagnosis not present

## 2016-02-23 DIAGNOSIS — H353132 Nonexudative age-related macular degeneration, bilateral, intermediate dry stage: Secondary | ICD-10-CM | POA: Diagnosis not present

## 2016-03-06 DIAGNOSIS — I6523 Occlusion and stenosis of bilateral carotid arteries: Secondary | ICD-10-CM | POA: Diagnosis not present

## 2016-03-06 DIAGNOSIS — H8110 Benign paroxysmal vertigo, unspecified ear: Secondary | ICD-10-CM | POA: Diagnosis not present

## 2016-04-05 DIAGNOSIS — Z0000 Encounter for general adult medical examination without abnormal findings: Secondary | ICD-10-CM | POA: Diagnosis not present

## 2016-04-05 DIAGNOSIS — Z79899 Other long term (current) drug therapy: Secondary | ICD-10-CM | POA: Diagnosis not present

## 2016-04-05 DIAGNOSIS — R238 Other skin changes: Secondary | ICD-10-CM | POA: Diagnosis not present

## 2016-04-05 DIAGNOSIS — H8110 Benign paroxysmal vertigo, unspecified ear: Secondary | ICD-10-CM | POA: Diagnosis not present

## 2016-04-05 DIAGNOSIS — H3530 Unspecified macular degeneration: Secondary | ICD-10-CM | POA: Diagnosis not present

## 2016-04-05 DIAGNOSIS — H9313 Tinnitus, bilateral: Secondary | ICD-10-CM | POA: Diagnosis not present

## 2016-04-12 DIAGNOSIS — R42 Dizziness and giddiness: Secondary | ICD-10-CM | POA: Diagnosis not present

## 2016-04-12 DIAGNOSIS — H903 Sensorineural hearing loss, bilateral: Secondary | ICD-10-CM | POA: Diagnosis not present

## 2016-04-16 DIAGNOSIS — H903 Sensorineural hearing loss, bilateral: Secondary | ICD-10-CM | POA: Diagnosis not present

## 2016-04-20 DIAGNOSIS — H353211 Exudative age-related macular degeneration, right eye, with active choroidal neovascularization: Secondary | ICD-10-CM | POA: Diagnosis not present

## 2016-04-23 DIAGNOSIS — H353211 Exudative age-related macular degeneration, right eye, with active choroidal neovascularization: Secondary | ICD-10-CM | POA: Diagnosis not present

## 2016-05-21 DIAGNOSIS — H353211 Exudative age-related macular degeneration, right eye, with active choroidal neovascularization: Secondary | ICD-10-CM | POA: Diagnosis not present

## 2016-06-13 DIAGNOSIS — Z23 Encounter for immunization: Secondary | ICD-10-CM | POA: Diagnosis not present

## 2016-07-02 DIAGNOSIS — H353211 Exudative age-related macular degeneration, right eye, with active choroidal neovascularization: Secondary | ICD-10-CM | POA: Diagnosis not present

## 2016-08-21 DIAGNOSIS — H353211 Exudative age-related macular degeneration, right eye, with active choroidal neovascularization: Secondary | ICD-10-CM | POA: Diagnosis not present

## 2016-10-08 DIAGNOSIS — Z125 Encounter for screening for malignant neoplasm of prostate: Secondary | ICD-10-CM | POA: Diagnosis not present

## 2016-10-08 DIAGNOSIS — Z79899 Other long term (current) drug therapy: Secondary | ICD-10-CM | POA: Diagnosis not present

## 2016-10-08 DIAGNOSIS — E782 Mixed hyperlipidemia: Secondary | ICD-10-CM | POA: Diagnosis not present

## 2016-10-08 DIAGNOSIS — M5136 Other intervertebral disc degeneration, lumbar region: Secondary | ICD-10-CM | POA: Diagnosis not present

## 2016-10-08 DIAGNOSIS — Z0000 Encounter for general adult medical examination without abnormal findings: Secondary | ICD-10-CM | POA: Diagnosis not present

## 2016-10-16 DIAGNOSIS — H353211 Exudative age-related macular degeneration, right eye, with active choroidal neovascularization: Secondary | ICD-10-CM | POA: Diagnosis not present

## 2016-10-19 DIAGNOSIS — L538 Other specified erythematous conditions: Secondary | ICD-10-CM | POA: Diagnosis not present

## 2016-10-19 DIAGNOSIS — L820 Inflamed seborrheic keratosis: Secondary | ICD-10-CM | POA: Diagnosis not present

## 2016-10-19 DIAGNOSIS — C4441 Basal cell carcinoma of skin of scalp and neck: Secondary | ICD-10-CM | POA: Diagnosis not present

## 2016-10-19 DIAGNOSIS — D485 Neoplasm of uncertain behavior of skin: Secondary | ICD-10-CM | POA: Diagnosis not present

## 2016-10-19 DIAGNOSIS — R208 Other disturbances of skin sensation: Secondary | ICD-10-CM | POA: Diagnosis not present

## 2016-10-29 DIAGNOSIS — C4441 Basal cell carcinoma of skin of scalp and neck: Secondary | ICD-10-CM | POA: Diagnosis not present

## 2016-12-14 DIAGNOSIS — D485 Neoplasm of uncertain behavior of skin: Secondary | ICD-10-CM | POA: Diagnosis not present

## 2016-12-14 DIAGNOSIS — L538 Other specified erythematous conditions: Secondary | ICD-10-CM | POA: Diagnosis not present

## 2016-12-14 DIAGNOSIS — L820 Inflamed seborrheic keratosis: Secondary | ICD-10-CM | POA: Diagnosis not present

## 2016-12-14 DIAGNOSIS — D0439 Carcinoma in situ of skin of other parts of face: Secondary | ICD-10-CM | POA: Diagnosis not present

## 2016-12-18 DIAGNOSIS — H532 Diplopia: Secondary | ICD-10-CM | POA: Diagnosis not present

## 2016-12-18 DIAGNOSIS — H353211 Exudative age-related macular degeneration, right eye, with active choroidal neovascularization: Secondary | ICD-10-CM | POA: Diagnosis not present

## 2016-12-19 DIAGNOSIS — M5432 Sciatica, left side: Secondary | ICD-10-CM | POA: Diagnosis not present

## 2016-12-19 DIAGNOSIS — M47816 Spondylosis without myelopathy or radiculopathy, lumbar region: Secondary | ICD-10-CM | POA: Diagnosis not present

## 2016-12-19 DIAGNOSIS — R3911 Hesitancy of micturition: Secondary | ICD-10-CM | POA: Diagnosis not present

## 2017-01-23 DIAGNOSIS — D0439 Carcinoma in situ of skin of other parts of face: Secondary | ICD-10-CM | POA: Diagnosis not present

## 2017-02-14 DIAGNOSIS — K648 Other hemorrhoids: Secondary | ICD-10-CM | POA: Diagnosis not present

## 2017-02-26 DIAGNOSIS — H353122 Nonexudative age-related macular degeneration, left eye, intermediate dry stage: Secondary | ICD-10-CM | POA: Diagnosis not present

## 2017-02-26 DIAGNOSIS — H353211 Exudative age-related macular degeneration, right eye, with active choroidal neovascularization: Secondary | ICD-10-CM | POA: Diagnosis not present

## 2017-05-01 DIAGNOSIS — M5136 Other intervertebral disc degeneration, lumbar region: Secondary | ICD-10-CM | POA: Diagnosis not present

## 2017-05-01 DIAGNOSIS — H3530 Unspecified macular degeneration: Secondary | ICD-10-CM | POA: Diagnosis not present

## 2017-05-01 DIAGNOSIS — Z1211 Encounter for screening for malignant neoplasm of colon: Secondary | ICD-10-CM | POA: Diagnosis not present

## 2017-05-01 DIAGNOSIS — E782 Mixed hyperlipidemia: Secondary | ICD-10-CM | POA: Diagnosis not present

## 2017-05-01 DIAGNOSIS — Z0000 Encounter for general adult medical examination without abnormal findings: Secondary | ICD-10-CM | POA: Diagnosis not present

## 2017-05-06 DIAGNOSIS — Z1211 Encounter for screening for malignant neoplasm of colon: Secondary | ICD-10-CM | POA: Diagnosis not present

## 2017-05-21 DIAGNOSIS — H353211 Exudative age-related macular degeneration, right eye, with active choroidal neovascularization: Secondary | ICD-10-CM | POA: Diagnosis not present

## 2017-06-26 DIAGNOSIS — X32XXXA Exposure to sunlight, initial encounter: Secondary | ICD-10-CM | POA: Diagnosis not present

## 2017-06-26 DIAGNOSIS — D225 Melanocytic nevi of trunk: Secondary | ICD-10-CM | POA: Diagnosis not present

## 2017-06-26 DIAGNOSIS — L821 Other seborrheic keratosis: Secondary | ICD-10-CM | POA: Diagnosis not present

## 2017-06-26 DIAGNOSIS — L538 Other specified erythematous conditions: Secondary | ICD-10-CM | POA: Diagnosis not present

## 2017-06-26 DIAGNOSIS — L820 Inflamed seborrheic keratosis: Secondary | ICD-10-CM | POA: Diagnosis not present

## 2017-06-26 DIAGNOSIS — Z08 Encounter for follow-up examination after completed treatment for malignant neoplasm: Secondary | ICD-10-CM | POA: Diagnosis not present

## 2017-06-26 DIAGNOSIS — L570 Actinic keratosis: Secondary | ICD-10-CM | POA: Diagnosis not present

## 2017-06-26 DIAGNOSIS — Z85828 Personal history of other malignant neoplasm of skin: Secondary | ICD-10-CM | POA: Diagnosis not present

## 2017-08-08 DIAGNOSIS — E782 Mixed hyperlipidemia: Secondary | ICD-10-CM | POA: Diagnosis not present

## 2017-08-08 DIAGNOSIS — H3530 Unspecified macular degeneration: Secondary | ICD-10-CM | POA: Diagnosis not present

## 2017-08-08 DIAGNOSIS — M5136 Other intervertebral disc degeneration, lumbar region: Secondary | ICD-10-CM | POA: Diagnosis not present

## 2017-08-13 DIAGNOSIS — H353122 Nonexudative age-related macular degeneration, left eye, intermediate dry stage: Secondary | ICD-10-CM | POA: Diagnosis not present

## 2017-08-13 DIAGNOSIS — H353211 Exudative age-related macular degeneration, right eye, with active choroidal neovascularization: Secondary | ICD-10-CM | POA: Diagnosis not present

## 2017-10-18 DIAGNOSIS — D225 Melanocytic nevi of trunk: Secondary | ICD-10-CM | POA: Diagnosis not present

## 2017-10-18 DIAGNOSIS — Z08 Encounter for follow-up examination after completed treatment for malignant neoplasm: Secondary | ICD-10-CM | POA: Diagnosis not present

## 2017-10-18 DIAGNOSIS — X32XXXA Exposure to sunlight, initial encounter: Secondary | ICD-10-CM | POA: Diagnosis not present

## 2017-10-18 DIAGNOSIS — D2271 Melanocytic nevi of right lower limb, including hip: Secondary | ICD-10-CM | POA: Diagnosis not present

## 2017-10-18 DIAGNOSIS — Z85828 Personal history of other malignant neoplasm of skin: Secondary | ICD-10-CM | POA: Diagnosis not present

## 2017-10-18 DIAGNOSIS — L570 Actinic keratosis: Secondary | ICD-10-CM | POA: Diagnosis not present

## 2017-11-05 DIAGNOSIS — H353211 Exudative age-related macular degeneration, right eye, with active choroidal neovascularization: Secondary | ICD-10-CM | POA: Diagnosis not present

## 2017-11-11 DIAGNOSIS — Z125 Encounter for screening for malignant neoplasm of prostate: Secondary | ICD-10-CM | POA: Diagnosis not present

## 2017-11-11 DIAGNOSIS — M5136 Other intervertebral disc degeneration, lumbar region: Secondary | ICD-10-CM | POA: Diagnosis not present

## 2017-11-11 DIAGNOSIS — E782 Mixed hyperlipidemia: Secondary | ICD-10-CM | POA: Diagnosis not present

## 2017-11-11 DIAGNOSIS — Z79899 Other long term (current) drug therapy: Secondary | ICD-10-CM | POA: Diagnosis not present

## 2017-11-11 DIAGNOSIS — M2550 Pain in unspecified joint: Secondary | ICD-10-CM | POA: Diagnosis not present

## 2017-11-11 DIAGNOSIS — Z0000 Encounter for general adult medical examination without abnormal findings: Secondary | ICD-10-CM | POA: Diagnosis not present

## 2017-11-11 DIAGNOSIS — M791 Myalgia, unspecified site: Secondary | ICD-10-CM | POA: Diagnosis not present

## 2017-11-19 DIAGNOSIS — M5442 Lumbago with sciatica, left side: Secondary | ICD-10-CM | POA: Diagnosis not present

## 2017-11-19 DIAGNOSIS — M5136 Other intervertebral disc degeneration, lumbar region: Secondary | ICD-10-CM | POA: Diagnosis not present

## 2017-11-19 DIAGNOSIS — K5900 Constipation, unspecified: Secondary | ICD-10-CM | POA: Diagnosis not present

## 2017-11-19 DIAGNOSIS — M5441 Lumbago with sciatica, right side: Secondary | ICD-10-CM | POA: Diagnosis not present

## 2017-12-24 DIAGNOSIS — H26491 Other secondary cataract, right eye: Secondary | ICD-10-CM | POA: Diagnosis not present

## 2018-01-03 DIAGNOSIS — G5601 Carpal tunnel syndrome, right upper limb: Secondary | ICD-10-CM | POA: Diagnosis not present

## 2018-01-13 DIAGNOSIS — R202 Paresthesia of skin: Secondary | ICD-10-CM | POA: Diagnosis not present

## 2018-01-13 DIAGNOSIS — R200 Anesthesia of skin: Secondary | ICD-10-CM | POA: Diagnosis not present

## 2018-01-13 DIAGNOSIS — R29898 Other symptoms and signs involving the musculoskeletal system: Secondary | ICD-10-CM | POA: Diagnosis not present

## 2018-01-28 DIAGNOSIS — H353211 Exudative age-related macular degeneration, right eye, with active choroidal neovascularization: Secondary | ICD-10-CM | POA: Diagnosis not present

## 2018-01-29 DIAGNOSIS — G5601 Carpal tunnel syndrome, right upper limb: Secondary | ICD-10-CM | POA: Diagnosis not present

## 2018-01-29 HISTORY — PX: OTHER SURGICAL HISTORY: SHX169

## 2018-03-12 DIAGNOSIS — S51002D Unspecified open wound of left elbow, subsequent encounter: Secondary | ICD-10-CM | POA: Diagnosis not present

## 2018-03-12 DIAGNOSIS — L089 Local infection of the skin and subcutaneous tissue, unspecified: Secondary | ICD-10-CM | POA: Diagnosis not present

## 2018-03-12 DIAGNOSIS — S51011A Laceration without foreign body of right elbow, initial encounter: Secondary | ICD-10-CM | POA: Diagnosis not present

## 2018-05-06 DIAGNOSIS — H353211 Exudative age-related macular degeneration, right eye, with active choroidal neovascularization: Secondary | ICD-10-CM | POA: Diagnosis not present

## 2018-05-06 DIAGNOSIS — H353122 Nonexudative age-related macular degeneration, left eye, intermediate dry stage: Secondary | ICD-10-CM | POA: Diagnosis not present

## 2018-05-23 DIAGNOSIS — Z79899 Other long term (current) drug therapy: Secondary | ICD-10-CM | POA: Diagnosis not present

## 2018-05-23 DIAGNOSIS — E782 Mixed hyperlipidemia: Secondary | ICD-10-CM | POA: Diagnosis not present

## 2018-05-23 DIAGNOSIS — H3530 Unspecified macular degeneration: Secondary | ICD-10-CM | POA: Diagnosis not present

## 2018-05-23 DIAGNOSIS — Z0000 Encounter for general adult medical examination without abnormal findings: Secondary | ICD-10-CM | POA: Diagnosis not present

## 2018-05-23 DIAGNOSIS — Z1211 Encounter for screening for malignant neoplasm of colon: Secondary | ICD-10-CM | POA: Diagnosis not present

## 2018-05-26 ENCOUNTER — Other Ambulatory Visit: Payer: Self-pay

## 2018-05-26 ENCOUNTER — Encounter: Payer: Self-pay | Admitting: Occupational Therapy

## 2018-05-26 ENCOUNTER — Ambulatory Visit: Payer: Medicare HMO | Attending: Orthopedic Surgery | Admitting: Occupational Therapy

## 2018-05-26 DIAGNOSIS — M25641 Stiffness of right hand, not elsewhere classified: Secondary | ICD-10-CM | POA: Diagnosis not present

## 2018-05-26 DIAGNOSIS — M6281 Muscle weakness (generalized): Secondary | ICD-10-CM

## 2018-05-26 DIAGNOSIS — R208 Other disturbances of skin sensation: Secondary | ICD-10-CM | POA: Diagnosis not present

## 2018-05-26 DIAGNOSIS — M79641 Pain in right hand: Secondary | ICD-10-CM | POA: Diagnosis not present

## 2018-05-26 NOTE — Patient Instructions (Signed)
Scar massage or mobs  Tendon glides  - 8 reps each  Med N glide - 5 reps  3 x day  Wrist extention stretch - 10 reps  3 x day   Padded glove for vibration act - like yard work Research scientist (physical sciences), Therapist, art, Location manager

## 2018-05-26 NOTE — Therapy (Signed)
[facility] [address] [place], [address] Phone: [phone]   Fax:  [phone]  Occupational Therapy Evaluation  Patient Details  Name: [name] MRN: [id number] Date of Birth: 08-01-1931 Referring Provider: Rudene Christians   Encounter Date: 05/26/2018  OT End of Session - 05/26/18 1410    Visit Number  1    Number of Visits  8    Date for OT Re-Evaluation  06/23/18    OT Start Time  1001    OT Stop Time  1058    OT Time Calculation (min)  57 min    Activity Tolerance  Patient tolerated treatment well    Behavior During Therapy  [facility] for tasks assessed/performed       No past medical history on file.  Past Surgical History:  Procedure Laterality Date  . carpaltunnel Right 01/29/2018    There were no vitals filed for this visit.  Subjective Assessment - 05/26/18 1111    Subjective   I had carpaltunnel surgery in May - had it for years - but put off the surgery - My fingers are burning , shooting , pins and needles pain into thumb thru middle fingers- numbness is not anymore in my arm or hand as much - moving towards my fingers - but pain can be bad - comes and goes and  hard time to sleep at times     Patient Stated Goals  I want to pain better , so I can  sleep better, use my hands to do things like yardwork, woodwork, do buttons     Currently in Pain?  Yes    Pain Score  7     Pain Location  Hand    Pain Orientation  Right    Pain Descriptors / Indicators  Burning;Tingling;Sharp;Shooting;Numbness;Pins and needles    Pain Type  Surgical pain    Pain Onset  More than a month ago    Pain Frequency  Intermittent        [facility] OT Assessment - 05/26/18 0001      Assessment   Medical Diagnosis  R CTR    Referring Provider  Rudene Christians    Onset Date/Surgical Date  01/29/18    Hand Dominance  Right      Home  Environment   Lives With  Spouse      Prior Function   Vocation  Retired    Leisure  Do some  yardwork, did in past Woodwork      AROM   Right Wrist Extension  56 Degrees    Left Wrist Extension  70 Degrees      Strength   Right Hand Grip (lbs)  32    Right Hand Lateral Pinch  8 lbs    Right Hand 3 Point Pinch  10 lbs    Left Hand Grip (lbs)  60    Left Hand Lateral Pinch  20 lbs    Left Hand 3 Point Pinch  16 lbs      Right Hand AROM   R Thumb Opposition to Index  --   Opposition WNL    R Index  MCP 0-90  80 Degrees    R Index PIP 0-100  100 Degrees    R Long  MCP 0-90  85 Degrees    R Long PIP 0-100  100 Degrees    R Ring  MCP 0-90  90 Degrees    R Ring PIP 0-100  100 Degrees    R Little  MCP 0-90  90 Degrees    R Little PIP 0-100  100 Degrees       Paraffin done prior to scar mobs- and education     Scar massage or mobs  - hand out provided on all :  Tendon glides  - 8 reps each  Med N glide - 5 reps  3 x day  Wrist extention stretch - 10 reps  3 x day   Padded glove for vibration act - like yard work - blower, Therapist, art, Location manager   ed on nerve healing             OT Education - 05/26/18 1409    Education Details  Findings of eval - nerve healing - and homeprogram - paddedglove for vibration act     Person(s) Educated  Patient    Methods  Explanation;Demonstration;Handout    Comprehension  Verbalized understanding;Returned demonstration       OT Short Term Goals - 05/26/18 1620      OT SHORT TERM GOAL #1   Title  Pain on PRWHE improve with more than 20 points     Baseline  Nerve pain on PRHWE at eval 41/50     Time  3    Period  Weeks    Status  New    Target Date  06/16/18      OT SHORT TERM GOAL #2   Title  R scar tissue improve and wrist extention by 10 degrees to show improvement in pain     Baseline  scar tissue - adhere - and wrist exte 56, R, L 70     Time  3    Period  Weeks    Status  New    Target Date  06/16/18        OT Long Term Goals - 05/26/18 1621      OT LONG TERM GOAL #1   Title  Pt to be ind in HEP  to decrease scar tissue , increase ROM and improve pain and numbness     Baseline  no knowledge of HEP     Time  4    Period  Weeks    Status  New    Target Date  06/23/18      OT LONG TERM GOAL #2   Title  Grip strength in R hand improve with more than 10 lbs to carry objects  more than 5 lbs     Baseline  Grip R 32/ L 60 lbs     Time  4    Period  Weeks    Status  New    Target Date  06/23/18            Plan - 05/26/18 1410    Clinical Impression Statement  Pt present at OT eval s/p CTR on 01/29/18 - pt cont to have increase shooting , pins and needles, stabbing in thumb thru middle finger and some on side of ring finger - pt do report that numbness since surgery is better in forearm and palm  to base of index finger - pt  had positive Tinel at MP of thumb and MC of 2nd - and over CT scar - do show decrease Semmes weinstein distal of MP of thmb and 2nd /3rd digit -  decrease AROM in 2nd and 3rd MC flexion -and decrease grip and prehension strength - limiting his functional use of hand  in  ADl's and IADL's - pt do report that he waited long time to have surgery - did explain pt about nerve healing and that is can take few months - that numbness is moving more distal and  has positive Tinel at MP of thumb and MC of 2nd - pt do show some scar tissue     Occupational performance deficits (Please refer to evaluation for details):  ADL's;IADL's;Rest and Sleep;Play;Leisure    Rehab Potential  Good    Current Impairments/barriers affecting progress:  Had CTS for years prior to surgery     OT Frequency  --   1-2 x day    OT Duration  --   3 weeks   OT Treatment/Interventions  Self-care/ADL training;Paraffin;Patient/family education;Manual Therapy;Scar mobilization    Plan  assess progress with HEP     Clinical Decision Making  Several treatment options, min-mod task modification necessary    OT Home Exercise Plan  see pt instruction     Consulted and Agree with Plan of Care  Patient        Patient will benefit from skilled therapeutic intervention in order to improve the following deficits and impairments:  Pain, Impaired flexibility, Decreased coordination, Impaired sensation, Impaired UE functional use, Decreased strength  Visit Diagnosis: Other disturbances of skin sensation - Plan: Ot plan of care cert/re-cert  Pain in right hand - Plan: Ot plan of care cert/re-cert  Stiffness of right hand, not elsewhere classified - Plan: Ot plan of care cert/re-cert  Muscle weakness - Plan: Ot plan of care cert/re-cert    Problem List Patient Active Problem List   Diagnosis Date Noted  . Vertigo 02/01/2016    Rosalyn Gess OTR/L,CLT 05/26/2018, 4:30 PM  [facility] [address], [place], [address] Phone: [phone]   Fax:  [phone]  Name: [name] MRN: [id number] Date of Birth: November 21, 1930

## 2018-05-28 ENCOUNTER — Ambulatory Visit: Payer: Medicare HMO | Admitting: Occupational Therapy

## 2018-05-28 DIAGNOSIS — R208 Other disturbances of skin sensation: Secondary | ICD-10-CM

## 2018-05-28 DIAGNOSIS — M25641 Stiffness of right hand, not elsewhere classified: Secondary | ICD-10-CM | POA: Diagnosis not present

## 2018-05-28 DIAGNOSIS — M79641 Pain in right hand: Secondary | ICD-10-CM | POA: Diagnosis not present

## 2018-05-28 DIAGNOSIS — M6281 Muscle weakness (generalized): Secondary | ICD-10-CM | POA: Diagnosis not present

## 2018-05-28 NOTE — Patient Instructions (Signed)
Cont with same HEP  But add cica scar pad for night time and when on computer to cushion scar

## 2018-05-28 NOTE — Therapy (Signed)
[facility] [address], [place], [address] Phone: [phone]   Fax:  [phone]  Occupational Therapy Treatment  Patient Details  Name: [name] MRN: [id number] Date of Birth: December 25, 1930 Referring Provider: Rudene Christians   Encounter Date: 05/28/2018  OT End of Session - 05/28/18 1831    Visit Number  2    Number of Visits  8    Date for OT Re-Evaluation  06/23/18    OT Start Time  1430    OT Stop Time  1511    OT Time Calculation (min)  41 min    Activity Tolerance  Patient tolerated treatment well    Behavior During Therapy  [facility] for tasks assessed/performed       No past medical history on file.  Past Surgical History:  Procedure Laterality Date  . carpaltunnel Right 01/29/2018    There were no vitals filed for this visit.  Subjective Assessment - 05/28/18 1829    Subjective   So when I bend my wrist back and fingers - I feel some burning and pull over my palm and scar - do I need to feel that     Patient Stated Goals  I want to pain better , so I can  sleep better, use my hands to do things like yardwork, woodwork, do buttons     Currently in Pain?  Yes    Pain Score  6     Pain Location  Hand    Pain Orientation  Right    Pain Descriptors / Indicators  Burning;Sharp;Shooting;Numbness;Pins and needles    Pain Type  Surgical pain    Pain Onset  More than a month ago    Pain Frequency  Intermittent                   OT Treatments/Exercises (OP) - 05/28/18 0001      RUE Paraffin   Number Minutes Paraffin  10 Minutes    RUE Paraffin Location  Hand    Comments  prior to manaul  therapy  to decrease scat tissue       scar massage done - extractor used with tendon glides done - scar mobs by OT and CT spreads  [facility] graston tool nr for sweeping over volar wrist and forearm -and brushing over scar and palm -  Tendon glides and Med N glide review- pt cont to show increase wrist flexion  during tendon glides - pt to block wrist in neutral Pt fitted with cica scar pad for night time and use during day when on computer - because of report fingers feels funny when using mouse  Ed on use  And padded glove info provided for yard work -         OT Education - 05/28/18 1831    Education Details  homeprogram review -and padded glove - where to get them     Person(s) Educated  Patient    Methods  Explanation;Demonstration;Handout    Comprehension  Verbalized understanding;Returned demonstration       OT Short Term Goals - 05/26/18 1620      OT SHORT TERM GOAL #1   Title  Pain on PRWHE improve with more than 20 points     Baseline  Nerve pain on PRHWE at eval 41/50     Time  3    Period  Weeks    Status  New    Target Date  06/16/18      OT SHORT TERM GOAL #2   Title  R scar tissue improve and wrist extention by 10 degrees to show improvement in pain     Baseline  scar tissue - adhere - and wrist exte 56, R, L 70     Time  3    Period  Weeks    Status  New    Target Date  06/16/18        OT Long Term Goals - 05/26/18 1621      OT LONG TERM GOAL #1   Title  Pt to be ind in HEP to decrease scar tissue , increase ROM and improve pain and numbness     Baseline  no knowledge of HEP     Time  4    Period  Weeks    Status  New    Target Date  06/23/18      OT LONG TERM GOAL #2   Title  Grip strength in R hand improve with more than 10 lbs to carry objects  more than 5 lbs     Baseline  Grip R 32/ L 60 lbs     Time  4    Period  Weeks    Status  New    Target Date  06/23/18            Plan - 05/28/18 1833    Clinical Impression Statement  Pt was evaluated 2 days ago - pt cont to have some shooting , pins and needles, stabbin pain and numbness in R hand - pt do have some scar tissue and tightness at wrist and volar hand - done this date scar mobs , soft tissue and ROM /med N glides     Occupational performance deficits (Please refer to evaluation for  details):  ADL's;IADL's;Rest and Sleep;Play;Leisure    Rehab Potential  Good    Current Impairments/barriers affecting progress:  Had CTS for years prior to surgery     OT Frequency  --   1-2 x wk   OT Duration  --   3 wks   OT Treatment/Interventions  Self-care/ADL training;Paraffin;Patient/family education;Manual Therapy;Scar mobilization    Plan  assess progress with HEP     Clinical Decision Making  Several treatment options, min-mod task modification necessary    OT Home Exercise Plan  see pt instruction     Consulted and Agree with Plan of Care  Patient       Patient will benefit from skilled therapeutic intervention in order to improve the following deficits and impairments:  Pain, Impaired flexibility, Decreased coordination, Impaired sensation, Impaired UE functional use, Decreased strength  Visit Diagnosis: Other disturbances of skin sensation  Pain in right hand  Stiffness of right hand, not elsewhere classified  Muscle weakness    Problem List Patient Active Problem List   Diagnosis Date Noted  . Vertigo 02/01/2016    Rosalyn Gess OTR/l,CLT 05/28/2018, 6:36 PM  [facility] [address], [place], [address] Phone: [phone]   Fax:  [phone]  Name: KAEMON BARNETT MRN: [id number] Date of Birth: February 25, 1931

## 2018-06-02 ENCOUNTER — Ambulatory Visit: Payer: Medicare HMO | Admitting: Occupational Therapy

## 2018-06-02 DIAGNOSIS — Z1211 Encounter for screening for malignant neoplasm of colon: Secondary | ICD-10-CM | POA: Diagnosis not present

## 2018-06-05 ENCOUNTER — Ambulatory Visit: Payer: Medicare HMO | Admitting: Occupational Therapy

## 2018-06-05 DIAGNOSIS — M6281 Muscle weakness (generalized): Secondary | ICD-10-CM | POA: Diagnosis not present

## 2018-06-05 DIAGNOSIS — M79641 Pain in right hand: Secondary | ICD-10-CM | POA: Diagnosis not present

## 2018-06-05 DIAGNOSIS — R208 Other disturbances of skin sensation: Secondary | ICD-10-CM | POA: Diagnosis not present

## 2018-06-05 DIAGNOSIS — M25641 Stiffness of right hand, not elsewhere classified: Secondary | ICD-10-CM | POA: Diagnosis not present

## 2018-06-05 NOTE — Therapy (Signed)
[place] [address], [place], [address] Phone: [phone]   Fax:  [phone]  Occupational Therapy Treatment  Patient Details  Name: [name] MRN: [id number] Date of Birth: May 22, 1931 No data recorded  Encounter Date: 06/05/2018  OT End of Session - 06/05/18 1428    Visit Number  3    Number of Visits  8    Date for OT Re-Evaluation  06/23/18    OT Start Time  1345    OT Stop Time  1426    OT Time Calculation (min)  41 min    Activity Tolerance  Patient tolerated treatment well    Behavior During Therapy  [facility] for tasks assessed/performed       No past medical history on file.  Past Surgical History:  Procedure Laterality Date  . carpaltunnel Right 01/29/2018    There were no vitals filed for this visit.  Subjective Assessment - 06/05/18 1352    Subjective   I am doing better- has more feeling in my fingers - coming in on the index and middle fingers -and then also on thumb - can feel objects better and do not feel as thick or like cotton     Patient Stated Goals  I want to pain better , so I can  sleep better, use my hands to do things like yardwork, woodwork, do buttons     Currently in Pain?  No/denies         [facility] OT Assessment - 06/05/18 0001      Strength   Right Hand Grip (lbs)  40    Right Hand Lateral Pinch  12 lbs    Right Hand 3 Point Pinch  12 lbs    Left Hand Grip (lbs)  50    Left Hand Lateral Pinch  20 lbs    Left Hand 3 Point Pinch  16 lbs       Assess grip and prehension strength - see flowsheet- great progress - Pt report increase sensation and less numbness in thumb thru 3rd- will do Thornell Mule next session          OT Treatments/Exercises (OP) - 06/05/18 0001      RUE Paraffin   Number Minutes Paraffin  10 Minutes    RUE Paraffin Location  Hand    Comments  prior to maual therapy increase sensation and increaes ROM        scar massage done  - extractor used with tendon glides  - scar mobs by OT and CT spreads  Done graston tool nr2 for sweeping over volar wrist and forearm -and brushing over scar and palm -  Tendon glides and Med N glide review- pt cont to show increase wrist flexion during tendon glides - pt to block wrist in neutral And needed min A and mod v/c for sequence  Pt to cont with cica scar pad for night time and use during day when on computer - because of report fingers feels funny when using mouse  Ed on use  And padded glove info provided for yard work -stay away from vibration if he do not have padded gloves   [place] at 3.3MHZ, 20% and 0.8 intensity for 3 min at end of session of scar to decrease edema or pain      OT Education - 06/05/18 1428    Education Details  homeprogram review again  -and padded glove info - not to  do vibration without gloves  - where to get them     Person(s) Educated  Patient    Methods  Explanation;Demonstration;Handout    Comprehension  Verbalized understanding;Returned demonstration       OT Short Term Goals - 05/26/18 1620      OT SHORT TERM GOAL #1   Title  Pain on PRWHE improve with more than 20 points     Baseline  Nerve pain on PRHWE at eval 41/50     Time  3    Period  Weeks    Status  New    Target Date  06/16/18      OT SHORT TERM GOAL #2   Title  R scar tissue improve and wrist extention by 10 degrees to show improvement in pain     Baseline  scar tissue - adhere - and wrist exte 56, R, L 70     Time  3    Period  Weeks    Status  New    Target Date  06/16/18        OT Long Term Goals - 05/26/18 1621      OT LONG TERM GOAL #1   Title  Pt to be ind in HEP to decrease scar tissue , increase ROM and improve pain and numbness     Baseline  no knowledge of HEP     Time  4    Period  Weeks    Status  New    Target Date  06/23/18      OT LONG TERM GOAL #2   Title  Grip strength in R hand improve with more than 10 lbs to carry objects  more than 5 lbs      Baseline  Grip R 32/ L 60 lbs     Time  4    Period  Weeks    Status  New    Target Date  06/23/18            Plan - 06/05/18 1429    Clinical Impression Statement  Pt show improvement in sensation - less numbness per pt on thumb thru 4th - can feel textures better and showed increase grip and prehension strength - pt to cont with HEP - needed min A with mod v/c to review HEP again     Occupational performance deficits (Please refer to evaluation for details):  ADL's;IADL's;Rest and Sleep;Play;Leisure    Rehab Potential  Good    Current Impairments/barriers affecting progress:  Had CTS for years prior to surgery     OT Frequency  2x / week    OT Duration  2 weeks    OT Treatment/Interventions  Self-care/ADL training;Paraffin;Patient/family education;Manual Therapy;Scar mobilization    Plan  assess progress with HEP     Clinical Decision Making  Several treatment options, min-mod task modification necessary    OT Home Exercise Plan  see pt instruction     Consulted and Agree with Plan of Care  Patient       Patient will benefit from skilled therapeutic intervention in order to improve the following deficits and impairments:  Pain, Impaired flexibility, Decreased coordination, Impaired sensation, Impaired UE functional use, Decreased strength  Visit Diagnosis: Other disturbances of skin sensation  Pain in right hand  Stiffness of right hand, not elsewhere classified  Muscle weakness    Problem List Patient Active Problem List   Diagnosis Date Noted  . Vertigo 02/01/2016    Rosalyn Gess OTR/L,CLT 06/05/2018,  2:31 PM  [place] [facility] [address] [place], [address] Phone: [phone]   Fax:  [phone]  Name: [name] MRN: [id number] Date of Birth: [birth year]

## 2018-06-05 NOTE — Patient Instructions (Signed)
Same - focus on tendon glides with wrist neutral and Med N  Glide

## 2018-06-10 ENCOUNTER — Ambulatory Visit: Payer: Medicare HMO | Attending: Orthopedic Surgery | Admitting: Occupational Therapy

## 2018-06-10 DIAGNOSIS — R208 Other disturbances of skin sensation: Secondary | ICD-10-CM | POA: Diagnosis not present

## 2018-06-10 DIAGNOSIS — M6281 Muscle weakness (generalized): Secondary | ICD-10-CM

## 2018-06-10 DIAGNOSIS — M25641 Stiffness of right hand, not elsewhere classified: Secondary | ICD-10-CM | POA: Diagnosis not present

## 2018-06-10 DIAGNOSIS — M79641 Pain in right hand: Secondary | ICD-10-CM | POA: Diagnosis not present

## 2018-06-10 NOTE — Therapy (Signed)
[facility] [address], [place], [address] Phone: [phone]   Fax:  [phone]  Occupational Therapy Treatment  Patient Details  Name: [name] MRN: [id number] Date of Birth: [birth year] No data recorded  Encounter Date: 06/10/2018  OT End of Session - 06/10/18 1321    Visit Number  4    Number of Visits  8    Date for OT Re-Evaluation  06/23/18    OT Start Time  1257    OT Stop Time  1345    OT Time Calculation (min)  48 min    Activity Tolerance  Patient tolerated treatment well    Behavior During Therapy  [facility] for tasks assessed/performed       No past medical history on file.  Past Surgical History:  Procedure Laterality Date  . carpaltunnel Right 01/29/2018    There were no vitals filed for this visit.  Subjective Assessment - 06/10/18 1318    Subjective   Less numbness , pain - do not wake me up at night time anymore - and have more feeling middle finger and base of thumb and index  -the tip of thumb and index still numb     Patient Stated Goals  I want to pain better , so I can  sleep better, use my hands to do things like yardwork, woodwork, do buttons     Currently in Pain?  Yes    Pain Score  6     Pain Location  Hand    Pain Orientation  Right    Pain Descriptors / Indicators  Numbness;Pins and needles    Pain Type  Surgical pain    Pain Onset  More than a month ago    Pain Frequency  Intermittent         [facility] OT Assessment - 06/10/18 0001      Strength   Right Hand Grip (lbs)  50    Right Hand Lateral Pinch  12 lbs    Right Hand 3 Point Pinch  12 lbs    Left Hand Grip (lbs)  50    Left Hand Lateral Pinch  20 lbs    Left Hand 3 Point Pinch  16 lbs       Assess progress with grip and prehension strength - cont to make progress in grip  Sensation tested - Tinel now at IP of thumb and PIP of 2nd  Semmes Weinstein - was 4.56 no feel at IP of thumb and 2nd digit- deep  pressure only - middle fingers was 3.61 Pt to cont to do different textures with thumb thru 3rd  Pt report numbness and pain better - not waking him up at night time Thumb tip still feel tender or sore - and then numb and distal part of index still         OT Treatments/Exercises (OP) - 06/10/18 0001      Ultrasound   Ultrasound Location  CT scar    Ultrasound Parameters  3.3MHZ, 20 % at 0.8 intensity over CT scar at end of session     Ultrasound Goals  Edema      RUE Paraffin   Number Minutes Paraffin  10 Minutes    RUE Paraffin Location  Hand    Comments  prior to manual therapy - to decrease scar tissue         scar massage done - extractor used with tendon glides  -  scar mobs by OT and CT spreads  Done graston tool nr2 for sweeping over volar wrist and forearm -and brushing over scar and palm -  Tendon glides and Med N glide review- And needed min A and mod v/c for sequence  Pt to cont with cica scar pad for night time  Add prayer stretch for wrist extention this date and review   And padded glove info provided for yard work -stay away from vibration -        OT Education - 06/10/18 1321    Education Details  progress and nerve healing     Person(s) Educated  Patient    Methods  Explanation;Demonstration;Handout    Comprehension  Verbalized understanding;Returned demonstration       OT Short Term Goals - 05/26/18 1620      OT SHORT TERM GOAL #1   Title  Pain on PRWHE improve with more than 20 points     Baseline  Nerve pain on PRHWE at eval 41/50     Time  3    Period  Weeks    Status  New    Target Date  06/16/18      OT SHORT TERM GOAL #2   Title  R scar tissue improve and wrist extention by 10 degrees to show improvement in pain     Baseline  scar tissue - adhere - and wrist exte 56, R, L 70     Time  3    Period  Weeks    Status  New    Target Date  06/16/18        OT Long Term Goals - 05/26/18 1621      OT LONG TERM GOAL #1   Title  Pt to  be ind in HEP to decrease scar tissue , increase ROM and improve pain and numbness     Baseline  no knowledge of HEP     Time  4    Period  Weeks    Status  New    Target Date  06/23/18      OT LONG TERM GOAL #2   Title  Grip strength in R hand improve with more than 10 lbs to carry objects  more than 5 lbs     Baseline  Grip R 32/ L 60 lbs     Time  4    Period  Weeks    Status  New    Target Date  06/23/18            Plan - 06/10/18 1322    Clinical Impression Statement  Pt cont to show every time he comes in progress in sensation and nerve healing - pt this date on Semmes Weinstein still 4.56 no on thumb IP and 2nd digit from [facility] up - deep pressure only - but  3rd 3.61 can feel -and Tinel no positive at IP of thumb and PIP of 2nd - at evaluation was at MP of thumb and 2nd and 3rd     Occupational performance deficits (Please refer to evaluation for details):  ADL's;IADL's;Rest and Sleep;Play;Leisure    Rehab Potential  Good    Current Impairments/barriers affecting progress:  Had CTS for years prior to surgery     OT Frequency  2x / week    OT Duration  2 weeks    OT Treatment/Interventions  Self-care/ADL training;Paraffin;Patient/family education;Manual Therapy;Scar mobilization    Plan  assess progress with HEP  and update HEP as needed  Clinical Decision Making  Several treatment options, min-mod task modification necessary    OT Home Exercise Plan  see pt instruction     Consulted and Agree with Plan of Care  Patient       Patient will benefit from skilled therapeutic intervention in order to improve the following deficits and impairments:  Pain, Impaired flexibility, Decreased coordination, Impaired sensation, Impaired UE functional use, Decreased strength  Visit Diagnosis: Other disturbances of skin sensation  Pain in right hand  Stiffness of right hand, not elsewhere classified  Muscle weakness    Problem List Patient Active Problem List   Diagnosis  Date Noted  . Vertigo 02/01/2016    Rosalyn Gess OTR/L,CLT 06/10/2018, 3:49 PM  [facility] [address] [place], [address] Phone: [phone]   Fax:  [phone]  Name: [name] MRN: [id number] Date of Birth: 30-Jun-1931

## 2018-06-10 NOTE — Patient Instructions (Addendum)
Same but add prayer stretch for wrist extention

## 2018-06-12 ENCOUNTER — Ambulatory Visit: Payer: Medicare HMO | Admitting: Occupational Therapy

## 2018-06-12 DIAGNOSIS — M25641 Stiffness of right hand, not elsewhere classified: Secondary | ICD-10-CM

## 2018-06-12 DIAGNOSIS — M6281 Muscle weakness (generalized): Secondary | ICD-10-CM

## 2018-06-12 DIAGNOSIS — M79641 Pain in right hand: Secondary | ICD-10-CM

## 2018-06-12 DIAGNOSIS — R208 Other disturbances of skin sensation: Secondary | ICD-10-CM | POA: Diagnosis not present

## 2018-06-12 NOTE — Patient Instructions (Signed)
Same but decrease pressure thru palms during prayer stretch

## 2018-06-12 NOTE — Therapy (Signed)
[facility] [address] [place], [address] Phone: [phone]   Fax:  [phone]  Occupational Therapy Treatment  Patient Details  Name: [name] MRN: [id number] Date of Birth: 1931-03-23 No data recorded  Encounter Date: 06/12/2018  OT End of Session - 06/12/18 1313    Visit Number  5    Number of Visits  8    Date for OT Re-Evaluation  06/23/18    OT Start Time  1300    OT Stop Time  1338    OT Time Calculation (min)  38 min    Activity Tolerance  Patient tolerated treatment well    Behavior During Therapy  [facility] for tasks assessed/performed       No past medical history on file.  Past Surgical History:  Procedure Laterality Date  . carpaltunnel Right 01/29/2018    There were no vitals filed for this visit.  Subjective Assessment - 06/12/18 1309    Subjective   I over work that prayer stretch little more - felt strain into my forearm - so I stopped that one - fingers about same than 2 days ago     Patient Stated Goals  I want to pain better , so I can  sleep better, use my hands to do things like yardwork, woodwork, do buttons     Currently in Pain?  Yes    Pain Score  6     Pain Location  Hand    Pain Orientation  Right    Pain Descriptors / Indicators  Numbness;Pins and needles    Pain Type  Surgical pain    Pain Onset  More than a month ago    Pain Frequency  Intermittent           Sensation tested - Tinel now at IP of thumb and PIP of 2nd   Pt to cont to do different textures with thumb thru 3rd  Pt report numbness and pain better - not waking him up at night time Thumb tip still feel tender or sore - and then numb and distal part of index still           OT Treatments/Exercises (OP) - 06/12/18 0001      Ultrasound   Ultrasound Location  CT scar     Ultrasound Parameters  c.cMHZ, 20%, 0.8 intensity     Ultrasound Goals  Edema      RUE Paraffin   Number Minutes  Paraffin  10 Minutes    RUE Paraffin Location  Hand    Comments  Prior to manual therapy         scar massage done - extractor used with tendon glides - scar mobs by OT and CT spreads  Done graston tool nr42for sweeping over volar wrist and forearm -and brushing over scar and palm -  Tendon glides and Med N glide review- And needed min A and mod v/c for sequence Ptto contwith cica scar pad for night time  Reinforce for pt to not do to much pressure during prayer stretch for wrist extention thru palm  Pt ask about mowing lawn and vibration - pt did not get padded gloves yet  yard work -stay away from vibration       OT Education - 06/12/18 1313    Education Details  progress and nerve healing     Person(s) Educated  Patient    Methods  Explanation;Demonstration;Handout    Comprehension  Verbalized understanding;Returned demonstration       OT Short Term Goals - 05/26/18 1620      OT SHORT TERM GOAL #1   Title  Pain on PRWHE improve with more than 20 points     Baseline  Nerve pain on PRHWE at eval 41/50     Time  3    Period  Weeks    Status  New    Target Date  06/16/18      OT SHORT TERM GOAL #2   Title  R scar tissue improve and wrist extention by 10 degrees to show improvement in pain     Baseline  scar tissue - adhere - and wrist exte 56, R, L 70     Time  3    Period  Weeks    Status  New    Target Date  06/16/18        OT Long Term Goals - 05/26/18 1621      OT LONG TERM GOAL #1   Title  Pt to be ind in HEP to decrease scar tissue , increase ROM and improve pain and numbness     Baseline  no knowledge of HEP     Time  4    Period  Weeks    Status  New    Target Date  06/23/18      OT LONG TERM GOAL #2   Title  Grip strength in R hand improve with more than 10 lbs to carry objects  more than 5 lbs     Baseline  Grip R 32/ L 60 lbs     Time  4    Period  Weeks    Status  New    Target Date  06/23/18            Plan - 06/12/18 1313     Clinical Impression Statement  Pt cont to make progress in nerve healing and sensation - Tinel still at PIP of 2nd and IP of thumb - cont scar mobs - and pt to still hold of on using vibration tools- did not get gloves yet -  pt decrease to 1 x wk     Occupational performance deficits (Please refer to evaluation for details):  ADL's;IADL's;Rest and Sleep;Play;Leisure    Rehab Potential  Good    Current Impairments/barriers affecting progress:  Had CTS for years prior to surgery     OT Frequency  1x / week    OT Duration  2 weeks    OT Treatment/Interventions  Self-care/ADL training;Paraffin;Patient/family education;Manual Therapy;Scar mobilization    Plan  assess progress with HEP  and update HEP as needed     Clinical Decision Making  Several treatment options, min-mod task modification necessary    OT Home Exercise Plan  see pt instruction     Consulted and Agree with Plan of Care  Patient       Patient will benefit from skilled therapeutic intervention in order to improve the following deficits and impairments:  Pain, Impaired flexibility, Decreased coordination, Impaired sensation, Impaired UE functional use, Decreased strength  Visit Diagnosis: Other disturbances of skin sensation  Pain in right hand  Stiffness of right hand, not elsewhere classified  Muscle weakness    Problem List Patient Active Problem List   Diagnosis Date Noted  . Vertigo 02/01/2016    Rosalyn Gess OTR/L,CLT 06/12/2018, 1:41 PM  [facility] [address]  [place], [place], [address] Phone: [phone]   Fax:  [phone]  Name: KELEN LAURA MRN: [id number] Date of Birth: 1931-01-20

## 2018-06-19 ENCOUNTER — Ambulatory Visit: Payer: Medicare HMO | Admitting: Occupational Therapy

## 2018-06-19 DIAGNOSIS — M6281 Muscle weakness (generalized): Secondary | ICD-10-CM | POA: Diagnosis not present

## 2018-06-19 DIAGNOSIS — M25641 Stiffness of right hand, not elsewhere classified: Secondary | ICD-10-CM

## 2018-06-19 DIAGNOSIS — R208 Other disturbances of skin sensation: Secondary | ICD-10-CM | POA: Diagnosis not present

## 2018-06-19 DIAGNOSIS — M79641 Pain in right hand: Secondary | ICD-10-CM

## 2018-06-19 NOTE — Patient Instructions (Signed)
Same HEP  Info provided again for padded glove to use in garden act

## 2018-06-19 NOTE — Therapy (Signed)
[facility] [address] [place], [address] Phone: [phone]   Fax:  [phone]  Occupational Therapy Treatment  Patient Details  Name: [name] MRN: [id number] Date of Birth: 02/21/1931 No data recorded  Encounter Date: 06/19/2018  OT End of Session - 06/19/18 1437    Visit Number  6    Number of Visits  8    Date for OT Re-Evaluation  06/23/18    OT Start Time  1422    OT Stop Time  1505    OT Time Calculation (min)  43 min    Activity Tolerance  Patient tolerated treatment well    Behavior During Therapy  [facility] for tasks assessed/performed       No past medical history on file.  Past Surgical History:  Procedure Laterality Date  . carpaltunnel Right 01/29/2018    There were no vitals filed for this visit.  Subjective Assessment - 06/19/18 1433    Subjective   I done some stuff in the yard- not vibration - but some shuffling and can tell my hands are little more ache, tight - think there are more feeling on the middle finger tip    Patient Stated Goals  I want to pain better , so I can  sleep better, use my hands to do things like yardwork, woodwork, do buttons     Currently in Pain?  Yes    Pain Score  6     Pain Location  Hand    Pain Orientation  Right    Pain Descriptors / Indicators  Numbness;Pins and needles    Pain Type  Surgical pain    Pain Onset  More than a month ago    Pain Frequency  Intermittent                   OT Treatments/Exercises (OP) - 06/19/18 0001      Ultrasound   Ultrasound Location  CT scar     Ultrasound Parameters  3.3MHZ, 20%, 0.8  intensnty    Ultrasound Goals  Edema      RUE Paraffin   Number Minutes Paraffin  10 Minutes    RUE Paraffin Location  Hand    Comments  prior to manual therapy        scar massage done - extractor used with tendon glides - scar mobs by OT and CT spreads  Done graston tool nr36for sweeping over volar wrist  and forearm -and brushing over scar and palm -  Tendon glides and Med N glide review- And needed min A and mod v/c for sequence for tendon glides  Ptto contwith cica scar pad for night time Reinforce for pt to not do to much pressure during prayer stretch for wrist extention thru palm  and can add thumb RA with wrist extention   Info again provided for padded gloves for  mowing lawn and vibration - pt did not get padded gloves yet         OT Education - 06/19/18 1437    Education Details  progress and nerve healing     Person(s) Educated  Patient    Methods  Explanation;Demonstration;Handout    Comprehension  Verbalized understanding;Returned demonstration       OT Short Term Goals - 05/26/18 1620      OT SHORT TERM GOAL #1   Title  Pain on PRWHE improve with more than 20 points  Baseline  Nerve pain on PRHWE at eval 41/50     Time  3    Period  Weeks    Status  New    Target Date  06/16/18      OT SHORT TERM GOAL #2   Title  R scar tissue improve and wrist extention by 10 degrees to show improvement in pain     Baseline  scar tissue - adhere - and wrist exte 56, R, L 70     Time  3    Period  Weeks    Status  New    Target Date  06/16/18        OT Long Term Goals - 05/26/18 1621      OT LONG TERM GOAL #1   Title  Pt to be ind in HEP to decrease scar tissue , increase ROM and improve pain and numbness     Baseline  no knowledge of HEP     Time  4    Period  Weeks    Status  New    Target Date  06/23/18      OT LONG TERM GOAL #2   Title  Grip strength in R hand improve with more than 10 lbs to carry objects  more than 5 lbs     Baseline  Grip R 32/ L 60 lbs     Time  4    Period  Weeks    Status  New    Target Date  06/23/18            Plan - 06/19/18 1943    Clinical Impression Statement  Pt report increase sensation in middle finger - but still  numbness on index from PIP and thumb IP - pt report he did some yardwork with increase  achiness in bilateral hands- did not use vibration tools - need info on padded gloves again     Occupational performance deficits (Please refer to evaluation for details):  ADL's;IADL's;Rest and Sleep;Play;Leisure    Rehab Potential  Good    Current Impairments/barriers affecting progress:  Had CTS for years prior to surgery     OT Frequency  1x / week    OT Duration  2 weeks    OT Treatment/Interventions  Self-care/ADL training;Paraffin;Patient/family education;Manual Therapy;Scar mobilization    Plan  assess progress with HEP  and update HEP as needed     Clinical Decision Making  Several treatment options, min-mod task modification necessary    OT Home Exercise Plan  see pt instruction     Consulted and Agree with Plan of Care  Patient       Patient will benefit from skilled therapeutic intervention in order to improve the following deficits and impairments:  Pain, Impaired flexibility, Decreased coordination, Impaired sensation, Impaired UE functional use, Decreased strength  Visit Diagnosis: Other disturbances of skin sensation  Pain in right hand  Muscle weakness  Stiffness of right hand, not elsewhere classified    Problem List Patient Active Problem List   Diagnosis Date Noted  . Vertigo 02/01/2016    Rosalyn Gess OTR/L,CLT 06/19/2018, 7:45 PM  [facility] [address], [place], [address] Phone: [phone]   Fax:  [phone]  Name: [name] MRN: [id number] Date of Birth: May 26, 1931

## 2018-06-27 ENCOUNTER — Ambulatory Visit: Payer: Medicare HMO | Admitting: Occupational Therapy

## 2018-06-27 DIAGNOSIS — M25641 Stiffness of right hand, not elsewhere classified: Secondary | ICD-10-CM

## 2018-06-27 DIAGNOSIS — R208 Other disturbances of skin sensation: Secondary | ICD-10-CM

## 2018-06-27 DIAGNOSIS — M6281 Muscle weakness (generalized): Secondary | ICD-10-CM

## 2018-06-27 DIAGNOSIS — M79641 Pain in right hand: Secondary | ICD-10-CM | POA: Diagnosis not present

## 2018-06-27 NOTE — Therapy (Signed)
[facility] [address] [place], [address] Phone: [phone]   Fax:  [phone]  Occupational Therapy Treatment  Patient Details  Name: [name] MRN: [id number] Date of Birth: 1930/10/04 No data recorded  Encounter Date: 06/27/2018  OT End of Session - 06/27/18 0917    Visit Number  7    Number of Visits  9    Date for OT Re-Evaluation  08/08/18    OT Start Time  0857    OT Stop Time  0942    OT Time Calculation (min)  45 min    Activity Tolerance  Patient tolerated treatment well    Behavior During Therapy  [facility] for tasks assessed/performed       No past medical history on file.  Past Surgical History:  Procedure Laterality Date  . carpaltunnel Right 01/29/2018    There were no vitals filed for this visit.  Subjective Assessment - 06/27/18 0902    Subjective   The numbness in the thumb is getting better- now my thumb only have erasor size area on tip that is numb - and from middle of index and middle finger to tip    Patient Stated Goals  I want to pain better , so I can  sleep better, use my hands to do things like yardwork, woodwork, do buttons     Currently in Pain?  Yes    Pain Score  5     Pain Location  Wrist    Pain Orientation  Right    Pain Descriptors / Indicators  Numbness;Pins and needles    Pain Type  Surgical pain    Pain Onset  More than a month ago    Pain Frequency  Intermittent         [facility] OT Assessment - 06/27/18 0001      Strength   Right Hand Grip (lbs)  55    Right Hand Lateral Pinch  10 lbs    Right Hand 3 Point Pinch  13 lbs    Left Hand Grip (lbs)  55    Left Hand Lateral Pinch  20 lbs    Left Hand 3 Point Pinch  16 lbs               OT Treatments/Exercises (OP) - 06/27/18 0001      Ultrasound   Ultrasound Location  CT scar    Ultrasound Parameters  3.32mhz, 20%, 0.8 intensity     Ultrasound Goals  Edema      RUE Paraffin   Number  Minutes Paraffin  10 Minutes    RUE Paraffin Location  Hand    Comments  prior to manual therapy         scar massage done - extractor used with tendon glides - scar mobs by OT and CT spreads  Done graston tool nr41for sweeping over volar wrist and forearm -and brushing over scar and palm -  Tendon glides and Med N glide review- only needed min A on intrinsic fist   Ptto contwith cica scar pad for night time Reinforce for pt to not do to much pressure duringprayer stretch for wrist extentionthru palm  and can add thumb RA with wrist extention  need some review  Pt has info for padded gloves for  mowing lawn and vibration - pt did not get padded gloves yet       OT Education - 06/27/18 1448  Education Details  progress and nerve healing     Person(s) Educated  Patient    Methods  Explanation;Demonstration;Handout    Comprehension  Verbalized understanding;Returned demonstration       OT Short Term Goals - 06/27/18 1450      OT SHORT TERM GOAL #1   Title  Pain on PRWHE improve with more than 20 points     Baseline  Nerve pain on PRHWE at eval 41/50  improve greatly- will do next time     Time  3    Period  Weeks    Status  On-going    Target Date  07/18/18      OT SHORT TERM GOAL #2   Title  R scar tissue improve and wrist extention by 10 degrees to show improvement in pain     Status  Achieved        OT Long Term Goals - 06/27/18 1450      OT LONG TERM GOAL #1   Title  Pt to be ind in HEP to decrease scar tissue , increase ROM and improve pain and numbness     Status  Achieved      OT LONG TERM GOAL #2   Title  Grip strength in R hand improve with more than 10 lbs to carry objects  more than 5 lbs     Status  Achieved            Plan - 06/27/18 1448    Clinical Impression Statement  Pt cont to make progress in sensation in digits - numbness less in thumb IP , and stil distal to PIP of 2nd and 3rd - pt show increase grip strength - pt to cont  with HEP now for 2 wks     Occupational performance deficits (Please refer to evaluation for details):  ADL's;IADL's;Rest and Sleep;Play;Leisure    Rehab Potential  Good    Current Impairments/barriers affecting progress:  Had CTS for years prior to surgery     OT Frequency  Biweekly    OT Duration  2 weeks    OT Treatment/Interventions  Self-care/ADL training;Paraffin;Patient/family education;Manual Therapy;Scar mobilization    Plan  assess progress with HEP  and update HEP as needed     Clinical Decision Making  Several treatment options, min-mod task modification necessary    OT Home Exercise Plan  see pt instruction     Consulted and Agree with Plan of Care  Patient       Patient will benefit from skilled therapeutic intervention in order to improve the following deficits and impairments:  Pain, Impaired flexibility, Decreased coordination, Impaired sensation, Impaired UE functional use, Decreased strength  Visit Diagnosis: Other disturbances of skin sensation  Pain in right hand  Muscle weakness  Stiffness of right hand, not elsewhere classified    Problem List Patient Active Problem List   Diagnosis Date Noted  . Vertigo 02/01/2016    Rosalyn Gess OTR/L,CLT 06/27/2018, 2:55 PM  [facility] [address], [place], [address] Phone: [phone]   Fax:  [phone]  Name: [name] MRN: [id number] Date of Birth: September 19, 1930

## 2018-06-27 NOTE — Patient Instructions (Signed)
same

## 2018-07-09 ENCOUNTER — Ambulatory Visit: Payer: Medicare HMO | Admitting: Occupational Therapy

## 2018-07-09 DIAGNOSIS — M25641 Stiffness of right hand, not elsewhere classified: Secondary | ICD-10-CM

## 2018-07-09 DIAGNOSIS — M6281 Muscle weakness (generalized): Secondary | ICD-10-CM | POA: Diagnosis not present

## 2018-07-09 DIAGNOSIS — R208 Other disturbances of skin sensation: Secondary | ICD-10-CM

## 2018-07-09 DIAGNOSIS — M79641 Pain in right hand: Secondary | ICD-10-CM

## 2018-07-09 NOTE — Therapy (Signed)
[facility] [address], [place], [address] Phone: [phone]   Fax:  [phone]  Occupational Therapy Treatment  Patient Details  Name: [name] MRN: [id number] Date of Birth: 1930-09-11 No data recorded  Encounter Date: 07/09/2018  OT End of Session - 07/09/18 1706    Visit Number  8    Number of Visits  9    Date for OT Re-Evaluation  08/08/18    OT Start Time  1310    OT Stop Time  1355    OT Time Calculation (min)  45 min    Activity Tolerance  Patient tolerated treatment well    Behavior During Therapy  [facility] for tasks assessed/performed       No past medical history on file.  Past Surgical History:  Procedure Laterality Date  . carpaltunnel Right 01/29/2018    There were no vitals filed for this visit.  Subjective Assessment - 07/09/18 1703    Subjective   Still some numbness on the side of the tip of my thumb and index - but not numb - can feel but not as good as the others- I worked in the yard this week and got me some glove -and my hand felt better and the palm not as tender     Patient Stated Goals  I want to pain better , so I can  sleep better, use my hands to do things like yardwork, woodwork, do buttons     Currently in Pain?  No/denies         [facility] OT Assessment - 07/09/18 0001      Strength   Right Hand Grip (lbs)  55    Right Hand Lateral Pinch  10 lbs    Right Hand 3 Point Pinch  13 lbs    Left Hand Grip (lbs)  65    Left Hand Lateral Pinch  20 lbs    Left Hand 3 Point Pinch  16 lbs       Pt cont to show decrease 3 point and lat grip in R - but pt do not have full sensation yet- Tinel proximal of thumb IP , MC of 2nd and no Tinel at 3rd  pt able to feel light touch on 3rd   but decrease distal to PIP on 2nd and thumb IP  Improved since last time - 2 wks ago   With grip had some pain in volar wrist - and tendons   do report increase use the past 2 wks - yard  work - but using some padded glove- and report decrease tenderness in palm         OT Treatments/Exercises (OP) - 07/09/18 0001      RUE Paraffin   Number Minutes Paraffin  10 Minutes    RUE Paraffin Location  Hand    Comments  prior to manual therapy         scar massage done - scar mobs by OT and CT spreads  Done graston tool [id number] sweeping over volar wrist and forearm -and brushing over scar and palm -  Some adhesions and tightness  Tendon glides and Med N glide to cont with     Ptto contwith cica scar pad for night time   add putty this date - teal for gripping , lat and 3 point grip  10 -15 reps  Can increase to 3 sets over the next 2 wks  And then  issues green firm putty to start in 2 wks - if no issues  and can start with 1 set and work up to 3 sets again   Pt cont with HEP for month and contact me at that time again if needed    OT Education - 07/09/18 1706    Education Details  findings and putty HEP review    Person(s) Educated  Patient    Methods  Explanation;Demonstration;Handout    Comprehension  Verbalized understanding;Returned demonstration       OT Short Term Goals - 06/27/18 1450      OT SHORT TERM GOAL #1   Title  Pain on PRWHE improve with more than 20 points     Baseline  Nerve pain on PRHWE at eval 41/50  improve greatly- will do next time     Time  3    Period  Weeks    Status  On-going    Target Date  07/18/18      OT SHORT TERM GOAL #2   Title  R scar tissue improve and wrist extention by 10 degrees to show improvement in pain     Status  Achieved        OT Long Term Goals - 06/27/18 1450      OT LONG TERM GOAL #1   Title  Pt to be ind in HEP to decrease scar tissue , increase ROM and improve pain and numbness     Status  Achieved      OT LONG TERM GOAL #2   Title  Grip strength in R hand improve with more than 10 lbs to carry objects  more than 5 lbs     Status  Achieved            Plan - 07/09/18 1707     Clinical Impression Statement  Pt cont to make progress in sensation in digits - numbness only in IP of thumb , distal to PIP on 2nd - light touch intact on 3rd - Tinel proximal of thumb IP and MC of 2nd - prehension still decrease in R hand - but sensation and nerve still healing - pt report increase use -and issued putty for pt to use for  lat and 3 point pinch     Occupational performance deficits (Please refer to evaluation for details):  ADL's;IADL's;Rest and Sleep;Play;Leisure    Rehab Potential  Good    Current Impairments/barriers affecting progress:  Had CTS for years prior to surgery     OT Frequency  Monthly    OT Duration  4 weeks    OT Treatment/Interventions  Self-care/ADL training;Paraffin;Patient/family education;Manual Therapy;Scar mobilization    Plan  assess progress with HEP  and update HEP as needed     Clinical Decision Making  Several treatment options, min-mod task modification necessary    OT Home Exercise Plan  see pt instruction     Consulted and Agree with Plan of Care  Patient       Patient will benefit from skilled therapeutic intervention in order to improve the following deficits and impairments:  Pain, Impaired flexibility, Decreased coordination, Impaired sensation, Impaired UE functional use, Decreased strength  Visit Diagnosis: Other disturbances of skin sensation  Pain in right hand  Muscle weakness  Stiffness of right hand, not elsewhere classified    Problem List Patient Active Problem List   Diagnosis Date Noted  . Vertigo 02/01/2016    Rosalyn Gess OTR/L,CLT 07/09/2018, 5:11 PM  [place]  [facility] [address], [place], [address] Phone: [phone]   Fax:  [phone]  Name: [name] MRN: [id number] Date of Birth: Oct 04, 1930

## 2018-07-09 NOTE — Patient Instructions (Signed)
Cont with same HEP - increase use  Did add putty this date - teal for gripping , lat and 3 point grip  10 -15 reps  Can increase to 3 sets over the next 2 wks  And then issues green firm putty to start in 2 wks - if no issues  and can start with 1 set and work up to 3 sets again

## 2018-08-12 DIAGNOSIS — H353211 Exudative age-related macular degeneration, right eye, with active choroidal neovascularization: Secondary | ICD-10-CM | POA: Diagnosis not present

## 2018-08-12 DIAGNOSIS — H353122 Nonexudative age-related macular degeneration, left eye, intermediate dry stage: Secondary | ICD-10-CM | POA: Diagnosis not present

## 2018-08-22 DIAGNOSIS — R030 Elevated blood-pressure reading, without diagnosis of hypertension: Secondary | ICD-10-CM | POA: Diagnosis not present

## 2018-08-22 DIAGNOSIS — E782 Mixed hyperlipidemia: Secondary | ICD-10-CM | POA: Diagnosis not present

## 2018-08-22 DIAGNOSIS — Z79899 Other long term (current) drug therapy: Secondary | ICD-10-CM | POA: Diagnosis not present

## 2018-08-22 DIAGNOSIS — Z125 Encounter for screening for malignant neoplasm of prostate: Secondary | ICD-10-CM | POA: Diagnosis not present

## 2019-02-13 DIAGNOSIS — G5601 Carpal tunnel syndrome, right upper limb: Secondary | ICD-10-CM | POA: Diagnosis not present

## 2019-02-13 DIAGNOSIS — Z0000 Encounter for general adult medical examination without abnormal findings: Secondary | ICD-10-CM | POA: Diagnosis not present

## 2019-02-13 DIAGNOSIS — M5136 Other intervertebral disc degeneration, lumbar region: Secondary | ICD-10-CM | POA: Diagnosis not present

## 2019-02-13 DIAGNOSIS — E782 Mixed hyperlipidemia: Secondary | ICD-10-CM | POA: Diagnosis not present

## 2019-03-26 DIAGNOSIS — M795 Residual foreign body in soft tissue: Secondary | ICD-10-CM | POA: Diagnosis not present

## 2019-05-21 DIAGNOSIS — E119 Type 2 diabetes mellitus without complications: Secondary | ICD-10-CM | POA: Diagnosis not present

## 2019-05-21 DIAGNOSIS — Z87891 Personal history of nicotine dependence: Secondary | ICD-10-CM | POA: Diagnosis not present

## 2019-05-21 DIAGNOSIS — M79643 Pain in unspecified hand: Secondary | ICD-10-CM | POA: Diagnosis not present

## 2019-05-21 DIAGNOSIS — R202 Paresthesia of skin: Secondary | ICD-10-CM | POA: Diagnosis not present

## 2019-05-21 DIAGNOSIS — R030 Elevated blood-pressure reading, without diagnosis of hypertension: Secondary | ICD-10-CM | POA: Diagnosis not present

## 2019-05-21 DIAGNOSIS — E782 Mixed hyperlipidemia: Secondary | ICD-10-CM | POA: Diagnosis not present

## 2019-05-21 DIAGNOSIS — Z79899 Other long term (current) drug therapy: Secondary | ICD-10-CM | POA: Diagnosis not present

## 2019-05-21 DIAGNOSIS — R200 Anesthesia of skin: Secondary | ICD-10-CM | POA: Diagnosis not present

## 2019-06-12 DIAGNOSIS — Z87891 Personal history of nicotine dependence: Secondary | ICD-10-CM | POA: Diagnosis not present

## 2019-06-12 DIAGNOSIS — R350 Frequency of micturition: Secondary | ICD-10-CM | POA: Diagnosis not present

## 2019-06-12 DIAGNOSIS — R202 Paresthesia of skin: Secondary | ICD-10-CM | POA: Diagnosis not present

## 2019-06-12 DIAGNOSIS — N451 Epididymitis: Secondary | ICD-10-CM | POA: Diagnosis not present

## 2019-07-21 DIAGNOSIS — S4991XA Unspecified injury of right shoulder and upper arm, initial encounter: Secondary | ICD-10-CM | POA: Diagnosis not present

## 2019-07-21 DIAGNOSIS — W108XXA Fall (on) (from) other stairs and steps, initial encounter: Secondary | ICD-10-CM | POA: Diagnosis not present

## 2019-07-24 DIAGNOSIS — S46011A Strain of muscle(s) and tendon(s) of the rotator cuff of right shoulder, initial encounter: Secondary | ICD-10-CM | POA: Diagnosis not present

## 2019-07-24 DIAGNOSIS — W108XXA Fall (on) (from) other stairs and steps, initial encounter: Secondary | ICD-10-CM | POA: Diagnosis not present

## 2019-07-27 ENCOUNTER — Other Ambulatory Visit: Payer: Self-pay | Admitting: Student

## 2019-07-27 DIAGNOSIS — S46011A Strain of muscle(s) and tendon(s) of the rotator cuff of right shoulder, initial encounter: Secondary | ICD-10-CM

## 2019-08-07 ENCOUNTER — Ambulatory Visit: Payer: Medicare HMO

## 2019-08-20 DIAGNOSIS — M5136 Other intervertebral disc degeneration, lumbar region: Secondary | ICD-10-CM | POA: Diagnosis not present

## 2019-08-20 DIAGNOSIS — E782 Mixed hyperlipidemia: Secondary | ICD-10-CM | POA: Diagnosis not present

## 2019-08-20 DIAGNOSIS — Z87891 Personal history of nicotine dependence: Secondary | ICD-10-CM | POA: Diagnosis not present

## 2019-10-16 DIAGNOSIS — D225 Melanocytic nevi of trunk: Secondary | ICD-10-CM | POA: Diagnosis not present

## 2019-10-16 DIAGNOSIS — X32XXXA Exposure to sunlight, initial encounter: Secondary | ICD-10-CM | POA: Diagnosis not present

## 2019-10-16 DIAGNOSIS — D2262 Melanocytic nevi of left upper limb, including shoulder: Secondary | ICD-10-CM | POA: Diagnosis not present

## 2019-10-16 DIAGNOSIS — D2261 Melanocytic nevi of right upper limb, including shoulder: Secondary | ICD-10-CM | POA: Diagnosis not present

## 2019-10-16 DIAGNOSIS — Z85828 Personal history of other malignant neoplasm of skin: Secondary | ICD-10-CM | POA: Diagnosis not present

## 2019-10-16 DIAGNOSIS — D2272 Melanocytic nevi of left lower limb, including hip: Secondary | ICD-10-CM | POA: Diagnosis not present

## 2019-10-16 DIAGNOSIS — L570 Actinic keratosis: Secondary | ICD-10-CM | POA: Diagnosis not present

## 2019-10-16 DIAGNOSIS — L821 Other seborrheic keratosis: Secondary | ICD-10-CM | POA: Diagnosis not present

## 2019-11-24 DIAGNOSIS — N400 Enlarged prostate without lower urinary tract symptoms: Secondary | ICD-10-CM | POA: Diagnosis not present

## 2019-11-24 DIAGNOSIS — Z87891 Personal history of nicotine dependence: Secondary | ICD-10-CM | POA: Diagnosis not present

## 2019-11-24 DIAGNOSIS — E785 Hyperlipidemia, unspecified: Secondary | ICD-10-CM | POA: Diagnosis not present

## 2019-11-24 DIAGNOSIS — Z0000 Encounter for general adult medical examination without abnormal findings: Secondary | ICD-10-CM | POA: Diagnosis not present

## 2019-11-24 DIAGNOSIS — I10 Essential (primary) hypertension: Secondary | ICD-10-CM | POA: Diagnosis not present

## 2019-12-15 DIAGNOSIS — H353211 Exudative age-related macular degeneration, right eye, with active choroidal neovascularization: Secondary | ICD-10-CM | POA: Diagnosis not present

## 2019-12-29 DIAGNOSIS — H353211 Exudative age-related macular degeneration, right eye, with active choroidal neovascularization: Secondary | ICD-10-CM | POA: Diagnosis not present

## 2020-02-18 DIAGNOSIS — R5383 Other fatigue: Secondary | ICD-10-CM | POA: Diagnosis not present

## 2020-02-18 DIAGNOSIS — R5381 Other malaise: Secondary | ICD-10-CM | POA: Diagnosis not present

## 2020-02-18 DIAGNOSIS — Z79899 Other long term (current) drug therapy: Secondary | ICD-10-CM | POA: Diagnosis not present

## 2020-02-18 DIAGNOSIS — E538 Deficiency of other specified B group vitamins: Secondary | ICD-10-CM | POA: Diagnosis not present

## 2020-02-18 DIAGNOSIS — E782 Mixed hyperlipidemia: Secondary | ICD-10-CM | POA: Diagnosis not present

## 2020-02-18 DIAGNOSIS — Z125 Encounter for screening for malignant neoplasm of prostate: Secondary | ICD-10-CM | POA: Diagnosis not present

## 2020-02-22 DIAGNOSIS — H353211 Exudative age-related macular degeneration, right eye, with active choroidal neovascularization: Secondary | ICD-10-CM | POA: Diagnosis not present

## 2020-02-25 DIAGNOSIS — E538 Deficiency of other specified B group vitamins: Secondary | ICD-10-CM | POA: Diagnosis not present

## 2020-02-25 DIAGNOSIS — Z87891 Personal history of nicotine dependence: Secondary | ICD-10-CM | POA: Diagnosis not present

## 2020-02-25 DIAGNOSIS — E782 Mixed hyperlipidemia: Secondary | ICD-10-CM | POA: Diagnosis not present

## 2020-04-19 DIAGNOSIS — H353211 Exudative age-related macular degeneration, right eye, with active choroidal neovascularization: Secondary | ICD-10-CM | POA: Diagnosis not present

## 2020-05-03 DIAGNOSIS — D485 Neoplasm of uncertain behavior of skin: Secondary | ICD-10-CM | POA: Diagnosis not present

## 2020-05-03 DIAGNOSIS — R208 Other disturbances of skin sensation: Secondary | ICD-10-CM | POA: Diagnosis not present

## 2020-05-03 DIAGNOSIS — D044 Carcinoma in situ of skin of scalp and neck: Secondary | ICD-10-CM | POA: Diagnosis not present

## 2020-06-09 DIAGNOSIS — D044 Carcinoma in situ of skin of scalp and neck: Secondary | ICD-10-CM | POA: Diagnosis not present

## 2020-06-17 DIAGNOSIS — H40003 Preglaucoma, unspecified, bilateral: Secondary | ICD-10-CM | POA: Diagnosis not present

## 2020-06-28 DIAGNOSIS — H353211 Exudative age-related macular degeneration, right eye, with active choroidal neovascularization: Secondary | ICD-10-CM | POA: Diagnosis not present

## 2020-08-15 DIAGNOSIS — K649 Unspecified hemorrhoids: Secondary | ICD-10-CM | POA: Diagnosis not present

## 2020-08-23 DIAGNOSIS — K645 Perianal venous thrombosis: Secondary | ICD-10-CM | POA: Diagnosis not present

## 2020-09-13 DIAGNOSIS — Z79899 Other long term (current) drug therapy: Secondary | ICD-10-CM | POA: Diagnosis not present

## 2020-09-13 DIAGNOSIS — E538 Deficiency of other specified B group vitamins: Secondary | ICD-10-CM | POA: Diagnosis not present

## 2020-09-13 DIAGNOSIS — Z0000 Encounter for general adult medical examination without abnormal findings: Secondary | ICD-10-CM | POA: Diagnosis not present

## 2020-09-13 DIAGNOSIS — E782 Mixed hyperlipidemia: Secondary | ICD-10-CM | POA: Diagnosis not present

## 2020-09-15 DIAGNOSIS — E538 Deficiency of other specified B group vitamins: Secondary | ICD-10-CM | POA: Diagnosis not present

## 2020-09-19 DIAGNOSIS — H353211 Exudative age-related macular degeneration, right eye, with active choroidal neovascularization: Secondary | ICD-10-CM | POA: Diagnosis not present

## 2020-10-20 DIAGNOSIS — E538 Deficiency of other specified B group vitamins: Secondary | ICD-10-CM | POA: Diagnosis not present

## 2020-11-21 DIAGNOSIS — E538 Deficiency of other specified B group vitamins: Secondary | ICD-10-CM | POA: Diagnosis not present

## 2020-12-14 DIAGNOSIS — E538 Deficiency of other specified B group vitamins: Secondary | ICD-10-CM | POA: Diagnosis not present

## 2020-12-14 DIAGNOSIS — E785 Hyperlipidemia, unspecified: Secondary | ICD-10-CM | POA: Diagnosis not present

## 2020-12-14 DIAGNOSIS — Z0000 Encounter for general adult medical examination without abnormal findings: Secondary | ICD-10-CM | POA: Diagnosis not present

## 2020-12-14 DIAGNOSIS — E119 Type 2 diabetes mellitus without complications: Secondary | ICD-10-CM | POA: Diagnosis not present

## 2020-12-16 DIAGNOSIS — Z0100 Encounter for examination of eyes and vision without abnormal findings: Secondary | ICD-10-CM | POA: Diagnosis not present

## 2020-12-16 DIAGNOSIS — H26492 Other secondary cataract, left eye: Secondary | ICD-10-CM | POA: Diagnosis not present

## 2020-12-19 DIAGNOSIS — H353211 Exudative age-related macular degeneration, right eye, with active choroidal neovascularization: Secondary | ICD-10-CM | POA: Diagnosis not present

## 2020-12-22 DIAGNOSIS — E538 Deficiency of other specified B group vitamins: Secondary | ICD-10-CM | POA: Diagnosis not present

## 2021-01-12 DIAGNOSIS — D225 Melanocytic nevi of trunk: Secondary | ICD-10-CM | POA: Diagnosis not present

## 2021-01-12 DIAGNOSIS — Z85828 Personal history of other malignant neoplasm of skin: Secondary | ICD-10-CM | POA: Diagnosis not present

## 2021-01-12 DIAGNOSIS — X32XXXA Exposure to sunlight, initial encounter: Secondary | ICD-10-CM | POA: Diagnosis not present

## 2021-01-12 DIAGNOSIS — D2262 Melanocytic nevi of left upper limb, including shoulder: Secondary | ICD-10-CM | POA: Diagnosis not present

## 2021-01-12 DIAGNOSIS — L570 Actinic keratosis: Secondary | ICD-10-CM | POA: Diagnosis not present

## 2021-01-12 DIAGNOSIS — D2261 Melanocytic nevi of right upper limb, including shoulder: Secondary | ICD-10-CM | POA: Diagnosis not present

## 2021-01-12 DIAGNOSIS — D2271 Melanocytic nevi of right lower limb, including hip: Secondary | ICD-10-CM | POA: Diagnosis not present

## 2021-01-24 DIAGNOSIS — E538 Deficiency of other specified B group vitamins: Secondary | ICD-10-CM | POA: Diagnosis not present

## 2021-02-24 DIAGNOSIS — E538 Deficiency of other specified B group vitamins: Secondary | ICD-10-CM | POA: Diagnosis not present

## 2021-03-21 DIAGNOSIS — H353211 Exudative age-related macular degeneration, right eye, with active choroidal neovascularization: Secondary | ICD-10-CM | POA: Diagnosis not present

## 2021-03-30 DIAGNOSIS — Z125 Encounter for screening for malignant neoplasm of prostate: Secondary | ICD-10-CM | POA: Diagnosis not present

## 2021-03-30 DIAGNOSIS — Z79899 Other long term (current) drug therapy: Secondary | ICD-10-CM | POA: Diagnosis not present

## 2021-03-30 DIAGNOSIS — E538 Deficiency of other specified B group vitamins: Secondary | ICD-10-CM | POA: Diagnosis not present

## 2021-03-30 DIAGNOSIS — E785 Hyperlipidemia, unspecified: Secondary | ICD-10-CM | POA: Diagnosis not present

## 2021-04-03 DIAGNOSIS — R490 Dysphonia: Secondary | ICD-10-CM | POA: Diagnosis not present

## 2021-04-03 DIAGNOSIS — J301 Allergic rhinitis due to pollen: Secondary | ICD-10-CM | POA: Diagnosis not present

## 2021-04-03 DIAGNOSIS — H6983 Other specified disorders of Eustachian tube, bilateral: Secondary | ICD-10-CM | POA: Diagnosis not present

## 2021-04-03 DIAGNOSIS — H6122 Impacted cerumen, left ear: Secondary | ICD-10-CM | POA: Diagnosis not present

## 2021-05-01 DIAGNOSIS — E538 Deficiency of other specified B group vitamins: Secondary | ICD-10-CM | POA: Diagnosis not present

## 2021-06-02 DIAGNOSIS — E538 Deficiency of other specified B group vitamins: Secondary | ICD-10-CM | POA: Diagnosis not present

## 2021-06-30 DIAGNOSIS — Z79899 Other long term (current) drug therapy: Secondary | ICD-10-CM | POA: Diagnosis not present

## 2021-06-30 DIAGNOSIS — Z125 Encounter for screening for malignant neoplasm of prostate: Secondary | ICD-10-CM | POA: Diagnosis not present

## 2021-06-30 DIAGNOSIS — E538 Deficiency of other specified B group vitamins: Secondary | ICD-10-CM | POA: Diagnosis not present

## 2021-06-30 DIAGNOSIS — E782 Mixed hyperlipidemia: Secondary | ICD-10-CM | POA: Diagnosis not present

## 2021-07-07 DIAGNOSIS — E7800 Pure hypercholesterolemia, unspecified: Secondary | ICD-10-CM | POA: Diagnosis not present

## 2021-07-07 DIAGNOSIS — E538 Deficiency of other specified B group vitamins: Secondary | ICD-10-CM | POA: Diagnosis not present

## 2021-07-11 DIAGNOSIS — H40003 Preglaucoma, unspecified, bilateral: Secondary | ICD-10-CM | POA: Diagnosis not present

## 2021-07-11 DIAGNOSIS — Z0100 Encounter for examination of eyes and vision without abnormal findings: Secondary | ICD-10-CM | POA: Diagnosis not present

## 2021-08-08 DIAGNOSIS — S60459A Superficial foreign body of unspecified finger, initial encounter: Secondary | ICD-10-CM | POA: Diagnosis not present

## 2021-08-08 DIAGNOSIS — E538 Deficiency of other specified B group vitamins: Secondary | ICD-10-CM | POA: Diagnosis not present

## 2021-09-08 DIAGNOSIS — E538 Deficiency of other specified B group vitamins: Secondary | ICD-10-CM | POA: Diagnosis not present

## 2021-10-11 DIAGNOSIS — E538 Deficiency of other specified B group vitamins: Secondary | ICD-10-CM | POA: Diagnosis not present

## 2021-10-19 DIAGNOSIS — E119 Type 2 diabetes mellitus without complications: Secondary | ICD-10-CM | POA: Diagnosis not present

## 2021-10-19 DIAGNOSIS — Z03818 Encounter for observation for suspected exposure to other biological agents ruled out: Secondary | ICD-10-CM | POA: Diagnosis not present

## 2021-10-19 DIAGNOSIS — R0789 Other chest pain: Secondary | ICD-10-CM | POA: Diagnosis not present

## 2021-11-10 DIAGNOSIS — E785 Hyperlipidemia, unspecified: Secondary | ICD-10-CM | POA: Diagnosis not present

## 2021-11-10 DIAGNOSIS — E538 Deficiency of other specified B group vitamins: Secondary | ICD-10-CM | POA: Diagnosis not present

## 2021-11-10 DIAGNOSIS — F32A Depression, unspecified: Secondary | ICD-10-CM | POA: Diagnosis not present

## 2021-11-10 DIAGNOSIS — Z79899 Other long term (current) drug therapy: Secondary | ICD-10-CM | POA: Diagnosis not present

## 2021-11-10 DIAGNOSIS — I10 Essential (primary) hypertension: Secondary | ICD-10-CM | POA: Diagnosis not present

## 2021-11-10 DIAGNOSIS — F419 Anxiety disorder, unspecified: Secondary | ICD-10-CM | POA: Diagnosis not present

## 2021-11-10 DIAGNOSIS — Z0000 Encounter for general adult medical examination without abnormal findings: Secondary | ICD-10-CM | POA: Diagnosis not present

## 2021-12-14 DIAGNOSIS — H353211 Exudative age-related macular degeneration, right eye, with active choroidal neovascularization: Secondary | ICD-10-CM | POA: Diagnosis not present

## 2021-12-14 DIAGNOSIS — H524 Presbyopia: Secondary | ICD-10-CM | POA: Diagnosis not present

## 2021-12-20 ENCOUNTER — Encounter: Payer: Self-pay | Admitting: Emergency Medicine

## 2021-12-20 ENCOUNTER — Other Ambulatory Visit: Payer: Self-pay

## 2021-12-20 ENCOUNTER — Emergency Department: Payer: Medicare HMO

## 2021-12-20 ENCOUNTER — Emergency Department
Admission: EM | Admit: 2021-12-20 | Discharge: 2021-12-20 | Disposition: A | Discharge status: Home / Self Care | Payer: Medicare HMO | Attending: Emergency Medicine | Admitting: Emergency Medicine

## 2021-12-20 DIAGNOSIS — Y9301 Activity, walking, marching and hiking: Secondary | ICD-10-CM | POA: Diagnosis not present

## 2021-12-20 DIAGNOSIS — W010XXA Fall on same level from slipping, tripping and stumbling without subsequent striking against object, initial encounter: Secondary | ICD-10-CM | POA: Insufficient documentation

## 2021-12-20 DIAGNOSIS — Y92096 Garden or yard of other non-institutional residence as the place of occurrence of the external cause: Secondary | ICD-10-CM | POA: Diagnosis not present

## 2021-12-20 DIAGNOSIS — I700 Atherosclerosis of aorta: Secondary | ICD-10-CM | POA: Diagnosis not present

## 2021-12-20 DIAGNOSIS — S46012A Strain of muscle(s) and tendon(s) of the rotator cuff of left shoulder, initial encounter: Secondary | ICD-10-CM | POA: Diagnosis not present

## 2021-12-20 DIAGNOSIS — S4992XA Unspecified injury of left shoulder and upper arm, initial encounter: Secondary | ICD-10-CM | POA: Diagnosis not present

## 2021-12-20 DIAGNOSIS — Y92009 Unspecified place in unspecified non-institutional (private) residence as the place of occurrence of the external cause: Secondary | ICD-10-CM

## 2021-12-20 DIAGNOSIS — M19012 Primary osteoarthritis, left shoulder: Secondary | ICD-10-CM | POA: Diagnosis not present

## 2021-12-20 NOTE — ED Provider Notes (Signed)
? ? ?[facility] ?Emergency Department Provider Note ? ? ? ? Event Date/Time  ? First MD Initiated Contact with Patient 12/20/21 1806   ?  (approximate) ? ? ?History  ? ?Fall ? ? ?HPI ? ?[name] is a 88 y.o. male with a history of vertigo, presents to the ED following mechanical fall.  Patient reports he tripped and fell while walking on his patio.  Denies any head injury or LOC.  He also denies any obvious direct injury or trauma to the left shoulder.  He presents with pain and disability to the left shoulder since the incident.  Denies any chest pain, shortness of breath, or distal paresthesias. ? ? ?Physical Exam  ? ?Triage Vital Signs: ?ED Triage Vitals  ?Enc Vitals Group  ?   BP 12/20/21 1623 134/80  ?   Pulse Rate 12/20/21 1623 68  ?   Resp 12/20/21 1623 18  ?   Temp 12/20/21 1623 97.9 ?F (36.6 ?C)  ?   Temp Source 12/20/21 1623 Oral  ?   SpO2 12/20/21 1623 100 %  ?   Weight 12/20/21 1621 147 lb (66.7 kg)  ?   Height 12/20/21 1621 '5\' 10"'$  (1.778 m)  ?   Head Circumference --   ?   Peak Flow --   ?   Pain Score 12/20/21 1621 8  ?   Pain Loc --   ?   Pain Edu? --   ?   Excl. in [place]? --   ? ? ?Most recent vital signs: ?Vitals:  ? 12/20/21 1623  ?BP: 134/80  ?Pulse: 68  ?Resp: 18  ?Temp: 97.9 ?F (36.6 ?C)  ?SpO2: 100%  ? ? ?General Awake, no distress.  ?HEENT NCAT. No rhinorrhea. Mucous membranes are moist. ?CV:  Good peripheral perfusion.  ?RESP:  Normal effort.  ?ABD:  No distention.  ?MSK:  Left shoulder without obvious deformity, dislocation, or sulcus sign.  No elbow effusion noted. Patient is mildly tender to palpation to the anterior shoulder at the biceps tendon.  Normal composite fist distally.  Patient is unable to demonstrate of active extension of the left shoulder without assistance from the right arm.  Similarly he is unable to demonstrate active abduction range on the shoulder.  Patient with positive drop arm test on exam.  He does have intact infraspinatus resistance test.   ? ? ?ED Results / Procedures / Treatments  ? ?Labs ?(all labs ordered are listed, but only abnormal results are displayed) ?Labs Reviewed - No data to display ? ? ?EKG ? ? ?RADIOLOGY ? ?I personally viewed and evaluated these images as part of my medical decision making, as well as reviewing the written report by the radiologist. ? ?ED Provider Interpretation: no acute findings} ? ?DG Shoulder Left ? ?Result Date: 12/20/2021 ?CLINICAL DATA:  Mechanical fall today, LEFT shoulder injury EXAM: LEFT SHOULDER - 2+ VIEW COMPARISON:  None FINDINGS: Mild osseous demineralization. AC joint alignment normal. Visualized ribs intact. Glenohumeral joint space narrowing. No acute fracture, dislocation, or bone destruction. Atherosclerotic calcification aorta. IMPRESSION: Osseous demineralization with LEFT glenohumeral degenerative changes. No acute osseous abnormalities. Aortic Atherosclerosis (ICD10-I70.0). Electronically Signed   By: Lavonia Dana M.D.   On: 12/20/2021 16:44   ? ? ?PROCEDURES: ? ?Critical Care performed: No ? ?Procedures ? ? ?MEDICATIONS ORDERED IN ED: ?Medications - No data to display ? ? ?IMPRESSION / MDM / ASSESSMENT AND PLAN / ED COURSE  ?I reviewed the triage vital signs and  the nursing notes. ?             ?               ? ?Differential diagnosis includes, but is not limited to, shoulder strain, rotator cuff tear, tendinitis, dislocation, humeral fracture ? ?Geriatric patient to the ED for evaluation of injury sustained following mechanical fall.  Patient presents with significant left arm pain and disability.  He is unable to demonstrate active extension or abduction of the left upper extremity.  With indication of a likely complete rotator cuff tear of the supraspinatus tendon on exam.  No radiologic evidence of any acute fracture or dislocation, based on my review of images. Patient will be discharged home with instructions to ftake OTC Tylenol and Advil, as needed. Patient is to follow up with PCP or  Ortho as needed or otherwise directed. Patient is given ED precautions to return to the ED for any worsening or new symptoms. ? ?FINAL CLINICAL IMPRESSION(S) / ED DIAGNOSES  ? ?Final diagnoses:  ?Fall in home, initial encounter  ?Traumatic complete tear of left rotator cuff, initial encounter  ? ? ? ?Rx / DC Orders  ? ?ED Discharge Orders   ? ? None  ? ?  ? ? ? ?Note:  This document was prepared using Dragon voice recognition software and may include unintentional dictation errors. ? ?  ?Melvenia Needles, PA-C ?12/20/21 1914 ? ?  ?Vanessa Varnville, MD ?12/20/21 2053 ? ?

## 2021-12-20 NOTE — ED Triage Notes (Signed)
Pt via POV from home. Pt had a mechanical fall today. States he fell on his L shoulder today. Denies head injury. Denies LOC. Denies blood thinners. Pt is A&Ox4 and NAD ?

## 2021-12-20 NOTE — Discharge Instructions (Addendum)
Your x-ray is negative with exception of some moderate arthritis in the shoulder joint.  Your exam however, reveals probable tear to the rotator cuff.  This will cause weakness with some range of motion to the left shoulder.  You may take OTC Tylenol and Aleve as needed for pain.  You may follow-up with orthopedics for further evaluation and management. ?

## 2022-01-15 DIAGNOSIS — X32XXXA Exposure to sunlight, initial encounter: Secondary | ICD-10-CM | POA: Diagnosis not present

## 2022-01-15 DIAGNOSIS — L820 Inflamed seborrheic keratosis: Secondary | ICD-10-CM | POA: Diagnosis not present

## 2022-01-15 DIAGNOSIS — Z85828 Personal history of other malignant neoplasm of skin: Secondary | ICD-10-CM | POA: Diagnosis not present

## 2022-01-15 DIAGNOSIS — Z08 Encounter for follow-up examination after completed treatment for malignant neoplasm: Secondary | ICD-10-CM | POA: Diagnosis not present

## 2022-01-15 DIAGNOSIS — L821 Other seborrheic keratosis: Secondary | ICD-10-CM | POA: Diagnosis not present

## 2022-01-15 DIAGNOSIS — D485 Neoplasm of uncertain behavior of skin: Secondary | ICD-10-CM | POA: Diagnosis not present

## 2022-01-15 DIAGNOSIS — L570 Actinic keratosis: Secondary | ICD-10-CM | POA: Diagnosis not present

## 2022-01-15 DIAGNOSIS — D044 Carcinoma in situ of skin of scalp and neck: Secondary | ICD-10-CM | POA: Diagnosis not present

## 2022-03-15 ENCOUNTER — Encounter: Payer: Self-pay | Admitting: Emergency Medicine

## 2022-03-15 DIAGNOSIS — Z85828 Personal history of other malignant neoplasm of skin: Secondary | ICD-10-CM | POA: Diagnosis not present

## 2022-03-15 DIAGNOSIS — Z483 Aftercare following surgery for neoplasm: Secondary | ICD-10-CM | POA: Insufficient documentation

## 2022-03-15 DIAGNOSIS — D044 Carcinoma in situ of skin of scalp and neck: Secondary | ICD-10-CM | POA: Diagnosis not present

## 2022-03-15 DIAGNOSIS — L570 Actinic keratosis: Secondary | ICD-10-CM | POA: Diagnosis not present

## 2022-03-15 DIAGNOSIS — S1180XA Unspecified open wound of other specified part of neck, initial encounter: Secondary | ICD-10-CM | POA: Diagnosis not present

## 2022-03-15 NOTE — ED Triage Notes (Signed)
Pt presents via POV for a bleeding from a biopsy site that was performed today for carcinoma. Bleeding controlled with gauze and pressure. Pts dressing is still applied from earlier and is not soaked. Pt has some blood on his shirt which he notes is from earlier today. Not on blood thinner nor ASA daily. Denies pain or fevers.

## 2022-03-16 ENCOUNTER — Emergency Department
Admission: EM | Admit: 2022-03-16 | Discharge: 2022-03-16 | Disposition: A | Discharge status: Home / Self Care | Payer: Medicare HMO | Attending: Emergency Medicine | Admitting: Emergency Medicine

## 2022-03-16 DIAGNOSIS — Z5189 Encounter for other specified aftercare: Secondary | ICD-10-CM

## 2022-03-16 NOTE — Discharge Instructions (Addendum)
Please follow-up with your dermatologist as scheduled.  If your wound begins bleeding again, please hold direct pressure for 30 minutes without letting go.  If this does not stop the bleeding, please return to the emergency department.

## 2022-03-16 NOTE — ED Provider Notes (Signed)
[facility] Provider Note    Event Date/Time   First MD Initiated Contact with Patient 03/16/22 [phone]     (approximate)   History   Wound Check   HPI  [name] is a 88 y.o. male with no significant past medical history who presents to the emergency department with for bleeding from a wound to the left side of his neck.  States he was seen by Dr. Evorn Gong with dermatology yesterday around 2:45 PM and had a skin cancer removed from the left side of his neck.  States he has multiple stitches in place.  States last night he was watching a movie when it suddenly started bleeding.  Bleeding has stopped since being in the waiting room.  He has no other acute complaints.  He is not on blood thinners.   History provided by patient and son.    History reviewed. No pertinent past medical history.  Past Surgical History:  Procedure Laterality Date   carpaltunnel Right 01/29/2018    MEDICATIONS:  Prior to Admission medications   Medication Sig Start Date End Date Taking? Authorizing Provider  calcium carbonate (OS-CAL - DOSED IN MG OF ELEMENTAL CALCIUM) 1250 (500 Ca) MG tablet Take 1 tablet by mouth daily.    [provider]  meclizine (ANTIVERT) 25 MG tablet Take 1 tablet (25 mg total) by mouth 3 (three) times daily as needed for dizziness. 02/02/16   Demetrios Loll, MD  Multiple Vitamins-Minerals (PRESERVISION AREDS 2 PO) Take 1 capsule by mouth daily.    [provider]    Physical Exam   Triage Vital Signs: ED Triage Vitals  Enc Vitals Group     BP 03/15/22 2306 139/78     Pulse Rate 03/15/22 2306 73     Resp 03/15/22 2306 20     Temp 03/15/22 2306 98.6 F (37 C)     Temp Source 03/15/22 2306 Oral     SpO2 03/15/22 2306 98 %     Weight 03/15/22 2305 145 lb (65.8 kg)     Height 03/15/22 2305 '5\' 10"'$  (1.778 m)     Head Circumference --      Peak Flow --      Pain Score 03/15/22 2305 0     Pain Loc --      Pain Edu? --      Excl. in  [place]? --     Most recent vital signs: Vitals:   03/15/22 2306 03/16/22 0340  BP: 139/78 (!) 155/73  Pulse: 73 66  Resp: 20 20  Temp: 98.6 F (37 C)   SpO2: 98% 99%     CONSTITUTIONAL: Alert and responds appropriately to questions. Well-appearing; well-nourished, elderly HEAD: Normocephalic, atraumatic EYES: Conjunctivae clear, pupils appear equal ENT: normal nose; moist mucous membranes NECK: Normal range of motion; patient has a wound that is covered with Steri-Strips to the left side of his neck.  Steri-Strips do not have any signs of blood on the.  Steri-Strips are covered with a gauze and also shows no signs of blood.  There is no active bleeding.  He has a strong carotid pulse. CARD: Regular rate and rhythm RESP: Normal chest excursion without splinting or tachypnea; no hypoxia or respiratory distress, speaking full sentences ABD/GI: non-distended EXT: Normal ROM in all joints, no major deformities noted SKIN: Normal color for age and race, no rashes on exposed skin NEURO: Moves all extremities equally, normal speech, no facial asymmetry noted, normal gait PSYCH: The  patient's mood and manner are appropriate. Grooming and personal hygiene are appropriate.  ED Results / Procedures / Treatments   LABS: (all labs ordered are listed, but only abnormal results are displayed) Labs Reviewed - No data to display   EKG:   RADIOLOGY: My personal review and interpretation of imaging:    I have personally reviewed all radiology reports. No results found.   PROCEDURES:  Critical Care performed: No      Procedures    IMPRESSION / MDM / ASSESSMENT AND PLAN / ED COURSE  I reviewed the triage vital signs and the nursing notes.   Patient here with bleeding from a recent surgical site that has now stopped.    DIFFERENTIAL DIAGNOSIS (includes but not limited to):   Bleeding from a surgical wound.  No sign of infection.  No active bleeding currently.  Hemodynamically  stable.  Doubt anemia.  Patient's presentation is most consistent with acute presentation with potential threat to life or bodily function.  PLAN: Patient here with bleeding from a surgical site where he had a skin cancer removed today by his dermatologist.  He is not actively bleeding and the Steri-Strips have no sign of any blood on them currently.  We discussed removing the Steri-Strips however they appeared to be adhered with benzoin and his son is concerned if I remove the strips that it could damage the skin underneath since he is elderly and has very thin skin.  I am also concerned that if he does have a scab over this area that is tamponading the bleeding that removing the Steri-Strips could make it start bleeding again.  Patient is worried that his jugular vein or carotid artery has been disrupted however his surgical site appears posterior to this and discussed with him if these vessels were involved that he would continue to have significant bleeding and would be hemodynamically unstable at this point.  Discussed supportive care instructions and bleeding precautions.  Patient and son are comfortable with this plan.  He will follow-up with his dermatologist as needed and as scheduled.  Patient has been in the waiting room for several hours without any recurrent bleeding today.  Blood pressure currently slightly hypertensive.   MEDICATIONS GIVEN IN ED: Medications - No data to display   ED COURSE:  At this time, I do not feel there is any life-threatening condition present. I reviewed all nursing notes, vitals, pertinent previous records.  All lab and urine results, EKGs, imaging ordered have been independently reviewed and interpreted by myself.  I reviewed all available radiology reports from any imaging ordered this visit.  Based on my assessment, I feel the patient is safe to be discharged home without further emergent workup and can continue workup as an outpatient as needed. Discussed all  findings, treatment plan as well as usual and customary return precautions.  They verbalize understanding and are comfortable with this plan.  Outpatient follow-up has been provided as needed.  All questions have been answered.    CONSULTS: No emergent consult needed for dermatology given bleeding is well controlled.  No signs of surrounding infection.  Patient hemodynamically stable.   OUTSIDE RECORDS REVIEWED: Reviewed patient's last office visit with Dr. Doy Hutching on 11/10/2021.     FINAL CLINICAL IMPRESSION(S) / ED DIAGNOSES   Final diagnoses:  Visit for wound check     Rx / DC Orders   ED Discharge Orders     None        Note:  This document  was prepared using Systems analyst and may include unintentional dictation errors.   Eulene Pekar, Delice Bison, DO 03/16/22 [phone]

## 2022-04-09 DIAGNOSIS — Z79899 Other long term (current) drug therapy: Secondary | ICD-10-CM | POA: Diagnosis not present

## 2022-04-09 DIAGNOSIS — I10 Essential (primary) hypertension: Secondary | ICD-10-CM | POA: Diagnosis not present

## 2022-04-09 DIAGNOSIS — E538 Deficiency of other specified B group vitamins: Secondary | ICD-10-CM | POA: Diagnosis not present

## 2022-04-09 DIAGNOSIS — F4323 Adjustment disorder with mixed anxiety and depressed mood: Secondary | ICD-10-CM | POA: Diagnosis not present

## 2022-04-09 DIAGNOSIS — Z0000 Encounter for general adult medical examination without abnormal findings: Secondary | ICD-10-CM | POA: Diagnosis not present

## 2022-04-26 DIAGNOSIS — R42 Dizziness and giddiness: Secondary | ICD-10-CM | POA: Diagnosis not present

## 2022-05-04 DIAGNOSIS — H353212 Exudative age-related macular degeneration, right eye, with inactive choroidal neovascularization: Secondary | ICD-10-CM | POA: Diagnosis not present

## 2022-08-14 DIAGNOSIS — F4323 Adjustment disorder with mixed anxiety and depressed mood: Secondary | ICD-10-CM | POA: Diagnosis not present

## 2022-08-14 DIAGNOSIS — E538 Deficiency of other specified B group vitamins: Secondary | ICD-10-CM | POA: Diagnosis not present

## 2022-08-14 DIAGNOSIS — R42 Dizziness and giddiness: Secondary | ICD-10-CM | POA: Diagnosis not present

## 2022-08-14 DIAGNOSIS — Z125 Encounter for screening for malignant neoplasm of prostate: Secondary | ICD-10-CM | POA: Diagnosis not present

## 2022-08-14 DIAGNOSIS — E7800 Pure hypercholesterolemia, unspecified: Secondary | ICD-10-CM | POA: Diagnosis not present

## 2022-08-14 DIAGNOSIS — R7309 Other abnormal glucose: Secondary | ICD-10-CM | POA: Diagnosis not present

## 2022-08-14 DIAGNOSIS — R011 Cardiac murmur, unspecified: Secondary | ICD-10-CM | POA: Diagnosis not present

## 2022-08-14 DIAGNOSIS — Z79899 Other long term (current) drug therapy: Secondary | ICD-10-CM | POA: Diagnosis not present

## 2022-08-15 ENCOUNTER — Other Ambulatory Visit (HOSPITAL_COMMUNITY): Payer: Self-pay | Admitting: Internal Medicine

## 2022-08-15 DIAGNOSIS — R42 Dizziness and giddiness: Secondary | ICD-10-CM

## 2022-08-24 ENCOUNTER — Ambulatory Visit
Admission: RE | Admit: 2022-08-24 | Discharge: 2022-08-24 | Disposition: A | Discharge status: Home / Self Care | Payer: Medicare HMO | Source: Ambulatory Visit | Attending: Internal Medicine | Admitting: Internal Medicine

## 2022-08-24 DIAGNOSIS — R42 Dizziness and giddiness: Secondary | ICD-10-CM | POA: Diagnosis not present

## 2022-09-17 ENCOUNTER — Emergency Department: Payer: Medicare HMO

## 2022-09-17 ENCOUNTER — Inpatient Hospital Stay
Admission: EM | Admit: 2022-09-17 | Discharge: 2022-09-24 | DRG: 149 | Disposition: A | Discharge status: Home Health | Payer: Medicare HMO | Attending: Internal Medicine | Admitting: Internal Medicine

## 2022-09-17 ENCOUNTER — Other Ambulatory Visit: Payer: Self-pay

## 2022-09-17 DIAGNOSIS — M1990 Unspecified osteoarthritis, unspecified site: Secondary | ICD-10-CM | POA: Diagnosis present

## 2022-09-17 DIAGNOSIS — E43 Unspecified severe protein-calorie malnutrition: Secondary | ICD-10-CM | POA: Diagnosis present

## 2022-09-17 DIAGNOSIS — I672 Cerebral atherosclerosis: Secondary | ICD-10-CM | POA: Diagnosis not present

## 2022-09-17 DIAGNOSIS — I6612 Occlusion and stenosis of left anterior cerebral artery: Secondary | ICD-10-CM | POA: Diagnosis not present

## 2022-09-17 DIAGNOSIS — D649 Anemia, unspecified: Secondary | ICD-10-CM | POA: Diagnosis not present

## 2022-09-17 DIAGNOSIS — G319 Degenerative disease of nervous system, unspecified: Secondary | ICD-10-CM | POA: Diagnosis not present

## 2022-09-17 DIAGNOSIS — I6622 Occlusion and stenosis of left posterior cerebral artery: Secondary | ICD-10-CM | POA: Diagnosis not present

## 2022-09-17 DIAGNOSIS — R42 Dizziness and giddiness: Secondary | ICD-10-CM | POA: Diagnosis not present

## 2022-09-17 DIAGNOSIS — R296 Repeated falls: Secondary | ICD-10-CM | POA: Diagnosis present

## 2022-09-17 DIAGNOSIS — Z79899 Other long term (current) drug therapy: Secondary | ICD-10-CM | POA: Diagnosis not present

## 2022-09-17 DIAGNOSIS — S7001XA Contusion of right hip, initial encounter: Secondary | ICD-10-CM | POA: Diagnosis present

## 2022-09-17 DIAGNOSIS — H353112 Nonexudative age-related macular degeneration, right eye, intermediate dry stage: Secondary | ICD-10-CM | POA: Diagnosis not present

## 2022-09-17 DIAGNOSIS — E785 Hyperlipidemia, unspecified: Secondary | ICD-10-CM | POA: Diagnosis not present

## 2022-09-17 DIAGNOSIS — W19XXXA Unspecified fall, initial encounter: Secondary | ICD-10-CM | POA: Diagnosis not present

## 2022-09-17 DIAGNOSIS — I669 Occlusion and stenosis of unspecified cerebral artery: Secondary | ICD-10-CM

## 2022-09-17 DIAGNOSIS — M25551 Pain in right hip: Secondary | ICD-10-CM

## 2022-09-17 DIAGNOSIS — R519 Headache, unspecified: Secondary | ICD-10-CM | POA: Diagnosis present

## 2022-09-17 DIAGNOSIS — E119 Type 2 diabetes mellitus without complications: Secondary | ICD-10-CM | POA: Diagnosis not present

## 2022-09-17 DIAGNOSIS — I10 Essential (primary) hypertension: Secondary | ICD-10-CM | POA: Diagnosis not present

## 2022-09-17 DIAGNOSIS — F419 Anxiety disorder, unspecified: Secondary | ICD-10-CM | POA: Diagnosis present

## 2022-09-17 DIAGNOSIS — Y92009 Unspecified place in unspecified non-institutional (private) residence as the place of occurrence of the external cause: Secondary | ICD-10-CM

## 2022-09-17 DIAGNOSIS — H8301 Labyrinthitis, right ear: Secondary | ICD-10-CM | POA: Diagnosis not present

## 2022-09-17 DIAGNOSIS — R012 Other cardiac sounds: Secondary | ICD-10-CM | POA: Diagnosis not present

## 2022-09-17 DIAGNOSIS — I6523 Occlusion and stenosis of bilateral carotid arteries: Secondary | ICD-10-CM | POA: Diagnosis not present

## 2022-09-17 DIAGNOSIS — R55 Syncope and collapse: Secondary | ICD-10-CM | POA: Diagnosis present

## 2022-09-17 DIAGNOSIS — Z888 Allergy status to other drugs, medicaments and biological substances status: Secondary | ICD-10-CM | POA: Diagnosis not present

## 2022-09-17 HISTORY — DX: Unspecified macular degeneration: H35.30

## 2022-09-17 LAB — TROPONIN I (HIGH SENSITIVITY)
Troponin I (High Sensitivity): 4 ng/L (ref <18)
Troponin I (High Sensitivity): 4 ng/L (ref <18)

## 2022-09-17 LAB — SALICYLATE LEVEL: Salicylate Lvl: <7 mg/dL — ABNORMAL LOW (ref 7.0–30.0)

## 2022-09-17 LAB — CBC
HCT: 42.7 % (ref 39.0–52.0)
Hemoglobin: 14.4 g/dL (ref 13.0–17.0)
MCH: 30.7 pg (ref 26.0–34.0)
MCHC: 33.7 g/dL (ref 30.0–36.0)
MCV: 91 fL (ref 80.0–100.0)
Platelets: 246 10*3/uL (ref 150–400)
RBC: 4.69 MIL/uL (ref 4.22–5.81)
RDW: 12 % (ref 11.5–15.5)
WBC: 9.9 10*3/uL (ref 4.0–10.5)
nRBC: 0 % (ref 0.0–0.2)

## 2022-09-17 LAB — URINALYSIS, ROUTINE W REFLEX MICROSCOPIC
Bilirubin Urine: NEGATIVE
Glucose, UA: NEGATIVE mg/dL
Hgb urine dipstick: NEGATIVE
Ketones, ur: 5 mg/dL — AB
Leukocytes,Ua: NEGATIVE
Nitrite: NEGATIVE
Protein, ur: NEGATIVE mg/dL
Specific Gravity, Urine: 1.01 (ref 1.005–1.030)
pH: 7 (ref 5.0–8.0)

## 2022-09-17 LAB — COMPREHENSIVE METABOLIC PANEL
ALT: 20 U/L (ref 0–44)
AST: 31 U/L (ref 15–41)
Albumin: 3.9 g/dL (ref 3.5–5.0)
Alkaline Phosphatase: 64 U/L (ref 38–126)
Anion gap: 8 (ref 5–15)
BUN: 9 mg/dL (ref 8–23)
CO2: 27 mmol/L (ref 22–32)
Calcium: 9.3 mg/dL (ref 8.9–10.3)
Chloride: 103 mmol/L (ref 98–111)
Creatinine, Ser: 0.75 mg/dL (ref 0.61–1.24)
GFR, Estimated: >60 mL/min (ref >60)
Glucose, Bld: 119 mg/dL — ABNORMAL HIGH (ref 70–99)
Potassium: 4.8 mmol/L (ref 3.5–5.1)
Sodium: 138 mmol/L (ref 135–145)
Total Bilirubin: 1.4 mg/dL — ABNORMAL HIGH (ref 0.3–1.2)
Total Protein: 6.9 g/dL (ref 6.5–8.1)

## 2022-09-17 MED ORDER — CALCIUM CARBONATE 1250 (500 CA) MG PO TABS
1.0000 | ORAL_TABLET | Freq: Every day | ORAL | Status: DC
  Administered 2022-09-18 – 2022-09-24 (×7): 1250 mg via ORAL
  Filled 2022-09-17 (×7): qty 1

## 2022-09-17 MED ORDER — ENOXAPARIN SODIUM 40 MG/0.4ML IJ SOSY
40.0000 mg | PREFILLED_SYRINGE | INTRAMUSCULAR | Status: DC
  Administered 2022-09-17 – 2022-09-23 (×7): 40 mg via SUBCUTANEOUS
  Filled 2022-09-17 (×7): qty 0.4

## 2022-09-17 MED ORDER — OXYCODONE-ACETAMINOPHEN 7.5-325 MG PO TABS
1.0000 | ORAL_TABLET | Freq: Four times a day (QID) | ORAL | Status: DC | PRN
  Administered 2022-09-17 – 2022-09-18 (×4): 1 via ORAL
  Filled 2022-09-17 (×4): qty 1

## 2022-09-17 MED ORDER — IOHEXOL 350 MG/ML SOLN
100.0000 mL | Freq: Once | INTRAVENOUS | Status: AC | PRN
  Administered 2022-09-17: 100 mL via INTRAVENOUS

## 2022-09-17 MED ORDER — MECLIZINE HCL 25 MG PO TABS
25.0000 mg | ORAL_TABLET | Freq: Once | ORAL | Status: AC
Start: 2022-09-17 — End: 2022-09-17
  Administered 2022-09-17: 25 mg via ORAL
  Filled 2022-09-17: qty 1

## 2022-09-17 MED ORDER — SODIUM CHLORIDE 0.9 % IV BOLUS
500.0000 mL | Freq: Once | INTRAVENOUS | Status: AC
Start: 2022-09-17 — End: 2022-09-17
  Administered 2022-09-17: 500 mL via INTRAVENOUS

## 2022-09-17 MED ORDER — ATORVASTATIN CALCIUM 20 MG PO TABS
80.0000 mg | ORAL_TABLET | Freq: Every day | ORAL | Status: DC
  Administered 2022-09-17 – 2022-09-24 (×8): 80 mg via ORAL
  Filled 2022-09-17 (×8): qty 4

## 2022-09-17 MED ORDER — ASPIRIN 81 MG PO TBEC
81.0000 mg | DELAYED_RELEASE_TABLET | Freq: Every day | ORAL | Status: DC
  Administered 2022-09-17 – 2022-09-24 (×8): 81 mg via ORAL
  Filled 2022-09-17 (×8): qty 1

## 2022-09-17 MED ORDER — ACETAMINOPHEN 325 MG PO TABS
650.0000 mg | ORAL_TABLET | Freq: Once | ORAL | Status: AC
  Administered 2022-09-17: 650 mg via ORAL
  Filled 2022-09-17: qty 2

## 2022-09-17 MED ORDER — FENTANYL CITRATE PF 50 MCG/ML IJ SOSY
25.0000 ug | PREFILLED_SYRINGE | Freq: Once | INTRAMUSCULAR | Status: AC
  Administered 2022-09-17: 25 ug via INTRAVENOUS
  Filled 2022-09-17: qty 1

## 2022-09-17 MED ORDER — PROSIGHT PO TABS
1.0000 | ORAL_TABLET | Freq: Every day | ORAL | Status: DC
  Administered 2022-09-18 – 2022-09-24 (×7): 1 via ORAL
  Filled 2022-09-17 (×9): qty 1

## 2022-09-17 NOTE — Assessment & Plan Note (Signed)
Fall at home in setting of dizziness/syncopal episode  No reported head trauma  + fall on R hip w/ normal plain films.  Pain control  PT/OT eval  Follow

## 2022-09-17 NOTE — Assessment & Plan Note (Signed)
+   dizziness and borderline syncopal episodes associated with using the bathroom as well as going from sitting to standing. Suspect strong vasovagal and orthostatic hypotension component of symptoms 08/2022 MRI brain WNL in setting of outpt eval of symptoms  Will plan for gentle IV fluid hydration as well as tolerating upper limit of normal blood pressures Noted vascular abnormalities on imaging today including moderate to severe stenosis of the left supraclinoid ICA, mild-to-moderate stenosis of the right supraclinoid ICA, moderate stenosis of the proximal left A2 segment, and mild-to-moderate stenosis of the proximal left P2 segment. Will place on baby ASA and statin treatment. Consider formal neurology consult in am

## 2022-09-17 NOTE — Assessment & Plan Note (Signed)
Prior diagnosis of vertigo. However meclizine ineffective to treat symptoms.  Monitor for now in setting of syncope eval and treatment  Titrate medication vs. Neuro consult as clinically indicated.  Follow

## 2022-09-17 NOTE — ED Notes (Signed)
Report off to [employer]

## 2022-09-17 NOTE — ED Notes (Signed)
Resumed care from natalie rn. Pt alert.  Family with pt.   No acute distress.  Iv in place.

## 2022-09-17 NOTE — ED Notes (Signed)
This RN received report and now assuming care of pt.

## 2022-09-17 NOTE — ED Triage Notes (Signed)
Fall this morning at home when sitting down on commode. Pt stated started feeling dizzy  while sitting down. Second fall around 2pm-ish while feeling dizzy. Pt has stated dizziness since fall. Pt currently complaining of "room spinning". Right sided pelvic hip pain started around 0730ish after first fall.  Last BP by EMS 179/71 mmHg and HR 75

## 2022-09-17 NOTE — ED Notes (Signed)
Ambulated with pt and walker. Pt stated he felt uneasy and nauseous. Pt side stepped towards top of bed and laid down.

## 2022-09-17 NOTE — ED Provider Notes (Addendum)
[facility] Provider Note    Event Date/Time   First MD Initiated Contact with Patient 09/17/22 1501     (approximate)   History   Fall and Dizziness   HPI  [name] is a 89 y.o. male with past medical history significant for anemia, hyperlipidemia, diabetes, who presents to the emergency department with an episode of dizziness and fall.  States that he was in his normal state of health yesterday.  Got up this morning at 4:00 to use the restroom which he states is normal for him and felt like he had to hold onto a wall because he was having dizziness.  Dizziness is worse when he moves his head or goes to stand up.  States he has ongoing swimming headedness also when he is at rest but is worse with ambulating.  Had a fall this morning when he went to go stand up from the bathroom hitting his right hip and head.  Laid in bed this afternoon until noon and when he attempted to get up again felt dizzy and had a second fall.  Recent MRI earlier this month that he was told was negative.  States that he has been evaluated by his primary care physician and the ear nose and throat specialist for his dizziness.  Denies any tinnitus or change of hearing.  Does endorse taking 650 mg of aspirin twice a day for his arthritis which is a new medication.  States that he decrease his medication yesterday since Saturday he felt like it was making him sick on his stomach.     Physical Exam   Triage Vital Signs: ED Triage Vitals  Enc Vitals Group     BP 09/17/22 1509 (!) 169/94     Pulse Rate 09/17/22 1503 74     Resp 09/17/22 1503 14     Temp 09/17/22 1503 97.6 F (36.4 C)     Temp Source 09/17/22 1503 Oral     SpO2 09/17/22 1503 99 %     Weight 09/17/22 1503 157 lb 10.1 oz (71.5 kg)     Height 09/17/22 1503 '5\' 10"'$  (1.778 m)     Head Circumference --      Peak Flow --      Pain Score 09/17/22 1502 9     Pain Loc --      Pain Edu? --      Excl. in [place]? --     Most  recent vital signs: Vitals:   09/17/22 1815 09/17/22 1830  BP: 134/76 (!) 144/83  Pulse: 79 82  Resp: 18 13  Temp:    SpO2: 98% 100%    Physical Exam Constitutional:      Appearance: He is well-developed.  HENT:     Head: Atraumatic.     Comments: Cerumen bilateral eardrums but no cerumen impaction to external auditory ear canal. Eyes:     Conjunctiva/sclera: Conjunctivae normal.     Comments: Extraocular movements intact with no cystadenomas.  Cardiovascular:     Rate and Rhythm: Regular rhythm.     Heart sounds: No murmur heard. Pulmonary:     Effort: No respiratory distress.  Abdominal:     General: There is no distension.  Musculoskeletal:     Cervical back: Normal range of motion. No tenderness.     Comments: Tenderness to the right hip.  No tenderness to the thoracic or lumbar spine.  No tenderness to the left hip.  Skin:  General: Skin is warm.  Neurological:     Mental Status: He is alert. Mental status is at baseline.     Comments: No dysmetria with finger-to-nose.  Dizziness and imbalance with standing and gait instability     IMPRESSION / MDM / ASSESSMENT AND PLAN / ED COURSE  I reviewed the triage vital signs and the nursing notes.  89 year old male presents to the emergency department for an episode of dizziness with multiple falls.  Patient is outside of the window for TNK.  NIH scale less than 6 and have a low suspicion for large vessel cutoff do not feel that patient needs to be an activated code stroke.  Patient has been having dizziness as an outpatient as well that could be peripheral vertigo given worsening with head movement.  Plan to do CTA head and neck to further evaluate for dissection or central cause of his dizziness.  On chart review patient did have an MRI in December for his dizziness that was negative for acute stroke.  Differential diagnosis including cardiac in nature, peripheral vertigo, central vertigo, hip fracture, strain.  No  midline cervical spine tenderness.  EKG  I, Nathaniel Man, the attending physician, personally viewed and interpreted this ECG.   Rate: Normal  Rhythm: Normal sinus  Axis: Normal  Intervals: Normal  ST&T Change: None  No tachycardic or bradycardic dysrhythmias while on cardiac telemetry.  RADIOLOGY I independently reviewed imaging, my interpretation of imaging: CTA head and neck, x-ray of the right hip  X-ray of the hip with no acute fracture or dislocation.  CT angiography with no significant stenosis or occlusion of the neck.  Intracranial atherosclerotic disease to the left ICA and right ICA.  LABS (all labs ordered are listed, but only abnormal results are displayed) Labs interpreted as -  No significant leukocytosis or anemia.  Creatinine at baseline.  No significant electrolyte abnormalities.  No signs of aspirin overdose  Labs Reviewed  COMPREHENSIVE METABOLIC PANEL - Abnormal; Notable for the following components:      Result Value   Glucose, Bld 119 (*)    Total Bilirubin 1.4 (*)    All other components within normal limits  URINALYSIS, ROUTINE W REFLEX MICROSCOPIC - Abnormal; Notable for the following components:   Color, Urine STRAW (*)    APPearance CLEAR (*)    Ketones, ur 5 (*)    All other components within normal limits  SALICYLATE LEVEL - Abnormal; Notable for the following components:   Salicylate Lvl <8.0 (*)    All other components within normal limits  CBC  TROPONIN I (HIGH SENSITIVITY)  TROPONIN I (HIGH SENSITIVITY)    TREATMENT   IV fluids, meclizine   On reevaluation attempted to ambulate the patient however patient had again dizziness with standing and almost fell/had a syncopal episode.  It is worse with head movement possible peripheral vertigo however given inability to ambulate will admit to workup for central causes of vertigo.  Consulted hospitalist for admission.  PROCEDURES:  Critical Care performed: No  Procedures  Patient's  presentation is most consistent with acute presentation with potential threat to life or bodily function.   MEDICATIONS ORDERED IN ED: Medications  fentaNYL (SUBLIMAZE) injection 25 mcg (25 mcg Intravenous Given 09/17/22 1719)  acetaminophen (TYLENOL) tablet 650 mg (650 mg Oral Given 09/17/22 1719)  iohexol (OMNIPAQUE) 350 MG/ML injection 100 mL (100 mLs Intravenous Contrast Given 09/17/22 1655)  sodium chloride 0.9 % bolus 500 mL (0 mLs Intravenous Stopped 09/17/22 1844)  meclizine (  ANTIVERT) tablet 25 mg (25 mg Oral Given 09/17/22 1845)    FINAL CLINICAL IMPRESSION(S) / ED DIAGNOSES   Final diagnoses:  Vertigo  Dizziness  Fall, initial encounter  Right hip pain     Rx / DC Orders   ED Discharge Orders     None        Note:  This document was prepared using Dragon voice recognition software and may include unintentional dictation errors.   Nathaniel Man, MD 09/17/22 7124    Nathaniel Man, MD 09/17/22 5809

## 2022-09-17 NOTE — H&P (Signed)
History and Physical    Patient: [name] ZOX:[id number] DOB: 05/25/1931 DOA: 09/17/2022 DOS: the patient was seen and examined on 09/17/2022 PCP: Idelle Crouch, MD  Patient coming from: Home  Chief Complaint:  Chief Complaint  Patient presents with   Fall   Dizziness   HPI: [name] is a 89 y.o. male with medical history significant of vertigo, hyperlipidemia, diabetes presenting with fall and dizziness.  Patient reports having episode of dizziness associated with using the bathroom earlier today.  Dizziness also noted to be present when patient goes from sitting to standing.  Patient did have a fall landing on the right hip.  No reported head trauma or loss of consciousness.  Symptoms have been a recurring issue for several months.  Has had an outpatient evaluation including an MRI of the brain that was otherwise normal.  Also had ENT evaluation that was within normal limits.  Was placed on a trial of meclizine.  This has been minimally effective.  No chest pain or shortness of breath.  Does have some intermittent episodes of feeling like the room is spinning.  No reports of hemiparesis slurred speech or confusion.  No chest pain or shortness of breath.  Patient denies any dizziness other than associated with going from sitting to standing and using the bathroom.  No reports of strenuous bowel movements.  No fevers or chills.  No body aches.   Presented to the ER afebrile, hemodynamically stable.  Satting well on room air.  Labs grossly within normal limits.  Urinalysis not indicative of infection.  Hip plain films within normal limits.  CT angio of the head and neck noted with moderate to severe stenosis of the left supraclinoid internal carotid artery, mild to moderate stenosis of the right supraclinoid internal carotid artery, moderate stenosis of the proximal left A2 segment and mild to moderate stenosis of the proximal left P2 segment. Review of Systems: As mentioned in the history of  present illness. All other systems reviewed and are negative. No past medical history on file. Past Surgical History:  Procedure Laterality Date   carpaltunnel Right 01/29/2018   Social History:  reports that he has never smoked. He does not have any smokeless tobacco history on file. He reports that he does not drink alcohol. No history on file for drug use.  Allergies  Allergen Reactions   Flexeril [Cyclobenzaprine]    Novocain [Procaine]     No family history on file.  Prior to Admission medications   Medication Sig Start Date End Date Taking? Authorizing Provider  Multiple Vitamins-Minerals (PRESERVISION AREDS 2 PO) Take 1 capsule by mouth daily.   Yes [provider]  sertraline (ZOLOFT) 50 MG tablet Take 1 tablet by mouth daily. 08/17/22 08/17/23 Yes [provider]  calcium carbonate (OS-CAL - DOSED IN MG OF ELEMENTAL CALCIUM) 1250 (500 Ca) MG tablet Take 1 tablet by mouth daily.    [provider]  meclizine (ANTIVERT) 25 MG tablet Take 1 tablet (25 mg total) by mouth 3 (three) times daily as needed for dizziness. Patient not taking: Reported on 09/17/2022 02/02/16   Demetrios Loll, MD    Physical Exam: Vitals:   09/17/22 1830 09/17/22 1930 09/17/22 1936 09/17/22 2030  BP: (!) 144/83 (!) 160/81  (!) 140/79  Pulse: 82 83  84  Resp: 13 17  (!) 27  Temp:   98.3 F (36.8 C)   TempSrc:   Oral   SpO2: 100% 97%  96%  Weight:      Height:       Physical Exam Constitutional:      Appearance: He is normal weight.  HENT:     Head: Normocephalic and atraumatic.  Eyes:     Extraocular Movements: Extraocular movements intact.     Pupils: Pupils are equal, round, and reactive to light.  Cardiovascular:     Rate and Rhythm: Bradycardia present.  Pulmonary:     Effort: Pulmonary effort is normal.     Breath sounds: No wheezing or rales.  Abdominal:     General: Bowel sounds are normal.  Musculoskeletal:     Comments: + R hip ttp  Otherwise grossly  normal MSK exam    Neurological:     General: No focal deficit present.  Psychiatric:        Mood and Affect: Mood normal.     Data Reviewed: There are no new results to review at this time.  Assessment and Plan: * Syncope + dizziness and borderline syncopal episodes associated with using the bathroom as well as going from sitting to standing. Suspect strong vasovagal and orthostatic hypotension component of symptoms 08/2022 MRI brain WNL in setting of outpt eval of symptoms  Will plan for gentle IV fluid hydration as well as tolerating upper limit of normal blood pressures Noted vascular abnormalities on imaging today including moderate to severe stenosis of the left supraclinoid ICA, mild-to-moderate stenosis of the right supraclinoid ICA, moderate stenosis of the proximal left A2 segment, and mild-to-moderate stenosis of the proximal left P2 segment. Will place on baby ASA and statin treatment. Consider formal neurology consult in am     Fall at home, initial encounter Fall at home in setting of dizziness/syncopal episode  No reported head trauma  + fall on R hip w/ normal plain films.  Pain control  PT/OT eval  Follow   Vertigo Prior diagnosis of vertigo. However meclizine ineffective to treat symptoms.  Monitor for now in setting of syncope eval and treatment  Titrate medication vs. Neuro consult as clinically indicated.  Follow       Advance Care Planning:   Code Status: Full Code   Consults: None. Consider formal neurology consultation.   Family Communication: Wife at the bedside   Severity of Illness: The appropriate patient status for this patient is OBSERVATION. Observation status is judged to be reasonable and necessary in order to provide the required intensity of service to ensure the patient's safety. The patient's presenting symptoms, physical exam findings, and initial radiographic and laboratory data in the context of their medical condition is felt to  place them at decreased risk for further clinical deterioration. Furthermore, it is anticipated that the patient will be medically stable for discharge from the hospital within 2 midnights of admission.   Author: Deneise Lever, MD 09/17/2022 9:56 PM  For on call review [email or web address].

## 2022-09-18 ENCOUNTER — Encounter: Payer: Self-pay | Admitting: Family Medicine

## 2022-09-18 ENCOUNTER — Inpatient Hospital Stay
Admit: 2022-09-18 | Discharge: 2022-09-18 | Disposition: A | Discharge status: Home / Self Care | Payer: Medicare HMO | Attending: Internal Medicine | Admitting: Internal Medicine

## 2022-09-18 DIAGNOSIS — I6523 Occlusion and stenosis of bilateral carotid arteries: Secondary | ICD-10-CM | POA: Diagnosis present

## 2022-09-18 DIAGNOSIS — M1990 Unspecified osteoarthritis, unspecified site: Secondary | ICD-10-CM | POA: Diagnosis present

## 2022-09-18 DIAGNOSIS — E785 Hyperlipidemia, unspecified: Secondary | ICD-10-CM | POA: Diagnosis present

## 2022-09-18 DIAGNOSIS — Z888 Allergy status to other drugs, medicaments and biological substances status: Secondary | ICD-10-CM | POA: Diagnosis not present

## 2022-09-18 DIAGNOSIS — F419 Anxiety disorder, unspecified: Secondary | ICD-10-CM

## 2022-09-18 DIAGNOSIS — R42 Dizziness and giddiness: Secondary | ICD-10-CM | POA: Diagnosis not present

## 2022-09-18 DIAGNOSIS — I669 Occlusion and stenosis of unspecified cerebral artery: Secondary | ICD-10-CM | POA: Diagnosis not present

## 2022-09-18 DIAGNOSIS — W19XXXA Unspecified fall, initial encounter: Secondary | ICD-10-CM | POA: Diagnosis present

## 2022-09-18 DIAGNOSIS — R296 Repeated falls: Secondary | ICD-10-CM | POA: Diagnosis present

## 2022-09-18 DIAGNOSIS — M25551 Pain in right hip: Secondary | ICD-10-CM

## 2022-09-18 DIAGNOSIS — E43 Unspecified severe protein-calorie malnutrition: Secondary | ICD-10-CM | POA: Diagnosis present

## 2022-09-18 DIAGNOSIS — Z79899 Other long term (current) drug therapy: Secondary | ICD-10-CM | POA: Diagnosis not present

## 2022-09-18 DIAGNOSIS — E119 Type 2 diabetes mellitus without complications: Secondary | ICD-10-CM | POA: Diagnosis present

## 2022-09-18 DIAGNOSIS — H8301 Labyrinthitis, right ear: Secondary | ICD-10-CM | POA: Diagnosis present

## 2022-09-18 DIAGNOSIS — R55 Syncope and collapse: Secondary | ICD-10-CM | POA: Diagnosis not present

## 2022-09-18 DIAGNOSIS — Y92009 Unspecified place in unspecified non-institutional (private) residence as the place of occurrence of the external cause: Secondary | ICD-10-CM | POA: Diagnosis not present

## 2022-09-18 DIAGNOSIS — S7001XA Contusion of right hip, initial encounter: Secondary | ICD-10-CM | POA: Diagnosis present

## 2022-09-18 DIAGNOSIS — D649 Anemia, unspecified: Secondary | ICD-10-CM | POA: Diagnosis present

## 2022-09-18 DIAGNOSIS — I6622 Occlusion and stenosis of left posterior cerebral artery: Secondary | ICD-10-CM | POA: Diagnosis present

## 2022-09-18 DIAGNOSIS — I672 Cerebral atherosclerosis: Secondary | ICD-10-CM | POA: Diagnosis present

## 2022-09-18 DIAGNOSIS — R519 Headache, unspecified: Secondary | ICD-10-CM | POA: Diagnosis present

## 2022-09-18 LAB — LIPID PANEL
Cholesterol: 131 mg/dL (ref 0–200)
HDL: 46 mg/dL (ref >40)
LDL Cholesterol: 75 mg/dL (ref 0–99)
Total CHOL/HDL Ratio: 2.8 RATIO
Triglycerides: 51 mg/dL (ref <150)
VLDL: 10 mg/dL (ref 0–40)

## 2022-09-18 LAB — COMPREHENSIVE METABOLIC PANEL
ALT: 20 U/L (ref 0–44)
AST: 25 U/L (ref 15–41)
Albumin: 3.7 g/dL (ref 3.5–5.0)
Alkaline Phosphatase: 60 U/L (ref 38–126)
Anion gap: 9 (ref 5–15)
BUN: 10 mg/dL (ref 8–23)
CO2: 24 mmol/L (ref 22–32)
Calcium: 8.8 mg/dL — ABNORMAL LOW (ref 8.9–10.3)
Chloride: 101 mmol/L (ref 98–111)
Creatinine, Ser: 0.8 mg/dL (ref 0.61–1.24)
GFR, Estimated: >60 mL/min (ref >60)
Glucose, Bld: 138 mg/dL — ABNORMAL HIGH (ref 70–99)
Potassium: 3.8 mmol/L (ref 3.5–5.1)
Sodium: 134 mmol/L — ABNORMAL LOW (ref 135–145)
Total Bilirubin: 1.6 mg/dL — ABNORMAL HIGH (ref 0.3–1.2)
Total Protein: 6.3 g/dL — ABNORMAL LOW (ref 6.5–8.1)

## 2022-09-18 LAB — CBC WITH DIFFERENTIAL/PLATELET
Abs Immature Granulocytes: 0.06 10*3/uL (ref 0.00–0.07)
Basophils Absolute: 0 10*3/uL (ref 0.0–0.1)
Basophils Relative: 0 %
Eosinophils Absolute: 0 10*3/uL (ref 0.0–0.5)
Eosinophils Relative: 0 %
HCT: 37.3 % — ABNORMAL LOW (ref 39.0–52.0)
Hemoglobin: 12.5 g/dL — ABNORMAL LOW (ref 13.0–17.0)
Immature Granulocytes: 0 %
Lymphocytes Relative: 8 %
Lymphs Abs: 1.1 10*3/uL (ref 0.7–4.0)
MCH: 30.2 pg (ref 26.0–34.0)
MCHC: 33.5 g/dL (ref 30.0–36.0)
MCV: 90.1 fL (ref 80.0–100.0)
Monocytes Absolute: 0.9 10*3/uL (ref 0.1–1.0)
Monocytes Relative: 6 %
Neutro Abs: 11.9 10*3/uL — ABNORMAL HIGH (ref 1.7–7.7)
Neutrophils Relative %: 86 %
Platelets: 232 10*3/uL (ref 150–400)
RBC: 4.14 MIL/uL — ABNORMAL LOW (ref 4.22–5.81)
RDW: 12 % (ref 11.5–15.5)
WBC: 14 10*3/uL — ABNORMAL HIGH (ref 4.0–10.5)
nRBC: 0 % (ref 0.0–0.2)

## 2022-09-18 MED ORDER — ORAL CARE MOUTH RINSE: 15.0000 mL | OROMUCOSAL | Status: DC | PRN

## 2022-09-18 MED ORDER — MECLIZINE HCL 25 MG PO TABS: 12.5000 mg | ORAL_TABLET | Freq: Three times a day (TID) | ORAL | Status: DC | PRN

## 2022-09-18 MED ORDER — SERTRALINE HCL 50 MG PO TABS
50.0000 mg | ORAL_TABLET | Freq: Every day | ORAL | Status: DC
  Administered 2022-09-19 – 2022-09-23 (×5): 50 mg via ORAL
  Filled 2022-09-18 (×6): qty 1

## 2022-09-18 MED ORDER — DIAZEPAM 2 MG PO TABS: 2.0000 mg | ORAL_TABLET | Freq: Two times a day (BID) | ORAL | Status: DC | PRN

## 2022-09-18 NOTE — Assessment & Plan Note (Addendum)
Check orthostatics.  Likely more secondary to vertigo.

## 2022-09-18 NOTE — Assessment & Plan Note (Signed)
Case discussed with neurology no intervention needed.  Aspirin and Lipitor.

## 2022-09-18 NOTE — TOC Initial Note (Signed)
Transition of Care [facility]) - Initial/Assessment Note    Patient Details  Name: [name] MRN: [id number] Date of Birth: 1931/06/16  Transition of [facility]) CM/SW Contact:    Candie Chroman, LCSW Phone Number: 09/18/2022, 3:18 PM  Clinical Narrative:  CSW met with patient. No supports at bedside. CSW introduced role and explained that therapy recommendations would be discussed. Patient is agreeable to home health. Gave CMS scores for agencies that serve his zip code. He will review with his cousin that he says is familiar with home health agencies and see if they have a preference. CSW will follow up tomorrow with preference. DME recommendations for RW and 3-in-1. His son-in-law has a RW, BSC, and shower chair that he can borrow. No further concerns. CSW encouraged patient to contact CSW as needed. CSW will continue to follow patient for support and facilitate return home once stable. His son or daughter will transport him home at discharge.               Expected Discharge Plan: Skilled Nursing Facility Barriers to Discharge: Continued Medical Work up   Patient Goals and CMS Choice   CMS Medicare.gov Compare Post Acute Care list provided to:: Patient        Expected Discharge Plan and Services     Post Acute Care Choice: [place] arrangements for the past 2 months: [place]                                      Prior Living Arrangements/Services Living arrangements for the past 2 months: Single Family Home Lives with:: Spouse Patient language and need for interpreter reviewed:: Yes Do you feel safe going back to the place where you live?: Yes      Need for Family Participation in Patient Care: Yes (Comment) Care giver support system in place?: Yes (comment)   Criminal Activity/Legal Involvement Pertinent to Current Situation/Hospitalization: No - Comment as needed  Activities of Daily Living Home Assistive Devices/Equipment: None ADL Screening  (condition at time of admission) Patient's cognitive ability adequate to safely complete daily activities?: Yes Is the patient deaf or have difficulty hearing?: Yes Does the patient have difficulty seeing, even when wearing glasses/contacts?: No Does the patient have difficulty concentrating, remembering, or making decisions?: No Patient able to express need for assistance with ADLs?: Yes Does the patient have difficulty dressing or bathing?: No Independently performs ADLs?: Yes (appropriate for developmental age) Does the patient have difficulty walking or climbing stairs?: Yes Weakness of Legs: None Weakness of Arms/Hands: Left  Permission Sought/Granted Permission sought to share information with : Facility Art therapist granted to share information with : Yes, Verbal Permission Granted     Permission granted to share info w AGENCY: Home Health Agencies        Emotional Assessment Appearance:: Appears stated age Attitude/Demeanor/Rapport: Engaged, Gracious Affect (typically observed): Accepting, Appropriate, Calm, Pleasant Orientation: : Oriented to Self, Oriented to Place, Oriented to  Time, Oriented to Situation Alcohol / Substance Use: Not Applicable Psych Involvement: No (comment)  Admission diagnosis:  Dizziness [R42] Syncope [R55] Vertigo [R42] Right hip pain [M25.551] Fall, initial encounter [W19.XXXA] Patient Active Problem List   Diagnosis Date Noted   Syncope 09/17/2022   Fall at home, initial encounter 09/17/2022   Vertigo 02/01/2016   PCP:  Idelle Crouch, MD Pharmacy:   [facility]  [place] - [address] [place]  [address] Phone: [phone] Fax: [phone]     Social Determinants of Health (SDOH) Social History: SDOH Screenings   Food Insecurity: No Food Insecurity (09/18/2022)  Housing: Low Risk  (09/18/2022)  Transportation Needs: No Transportation Needs (09/18/2022)  Utilities: Not  At Risk (09/18/2022)  Tobacco Use: Medium Risk (09/18/2022)   SDOH Interventions:     Readmission Risk Interventions     No data to display

## 2022-09-18 NOTE — Hospital Course (Signed)
89 year old male with past medical history of vertigo, hyperlipidemia, type 2 diabetes mellitus.  He states he got up to go to the bathroom and sat down.  He looked to the right and down and ended up on the ground he fell and hurt his hip.  He has a large bruise on his hip and having quite a bit of pain there. 1/9.  He worked with physical therapy more with walking and was limited with his hip and they did not do the vestibular training.

## 2022-09-18 NOTE — Evaluation (Signed)
Physical Therapy Evaluation Patient Details Name: [name] MRN: [id number] DOB: [birth year] Today's Date: 09/18/2022  History of Present Illness  [name] is an 39yoM who comes to [facility] on 09/17/22 after 2 episodes of dizziness weakness, which included a fall and episodic vertigo. PMH: anemia, HLD, DM. Has been evaluated by PCP, ENT in past month including a negative MRI brain. Mechanical fall at home in April 2023 with subsequent suspected full thickness tear of Left rotator cuff.  Clinical Impression  Pt admitted with above Dx. Pt has functional limitations due to deficits below (see "PT Problem List"). Pt able to provide details on baseline functional status. Today pt requires some physical assistance to perform bed mobility, transfers, due to significant intolerance to pressure of Right ischial tuberosity area, noted dark purple ecchymosis upon inspection, suspect hematoma given Hb drop in 1 day from 14s to 12s. Pt does much better toelrating standing at bedside, but is unsteady and requires 2 hand support to maintain steady static stance, progressively becomes queasy, crampy in legs, dizzy, assisted with transition from standing to supine bed. Pt asked to aviod any vertigo provoking head movements with mobility which he is able to do, hence no vertigo seen in this session. Assessment of BPPV deferred to later date due to high pain levels and higher priority to achieve the basics of mobility, however it should be noted that BPPV is not typically associated with syncope/presyncope as a symptoms. Pt will need some physical assistance with bed mobility and transfers which son reports he could be available to assist with if it means avoiding facility placement solely for this. Will need to troubleshoot some seat modifications as tolerance to soft surfaces is poor as of now. Patient's performance this date reveals decreased ability, independence, and tolerance in performing all basic mobility required for  performance of activities of daily living. Pt requires additional DME, close physical assistance, and cues for safe participate in mobility. Pt will benefit from skilled PT intervention to increase independence and safety with basic mobility in preparation for discharge to the venue listed below.     Orthostatic VS for the past 24 hrs (Last 3 readings):  BP- Lying Pulse- Lying BP- Sitting Pulse- Sitting  09/18/22 0947 137/79 76 144/86 96          Recommendations for follow up therapy are one component of a multi-disciplinary discharge planning process, led by the attending physician.  Recommendations may be updated based on patient status, additional functional criteria and insurance authorization.  Follow Up Recommendations Home health PT      Assistance Recommended at Discharge Set up Supervision/Assistance  Patient can return home with the following  A little help with walking and/or transfers;A little help with bathing/dressing/bathroom;Assistance with cooking/housework;Help with stairs or ramp for entrance;Assist for transportation;Direct supervision/assist for financial management;Direct supervision/assist for medications management    Equipment Recommendations Rolling walker (2 wheels)  Recommendations for Other Services       Functional Status Assessment Patient has had a recent decline in their functional status and demonstrates the ability to make significant improvements in function in a reasonable and predictable amount of time.     Precautions / Restrictions Precautions Precautions: Fall Restrictions Weight Bearing Restrictions: No      Mobility  Bed Mobility Overal bed mobility: Needs Assistance Bed Mobility: Supine to Sit, Sit to Supine     Supine to sit: Min assist Sit to supine: Min assist   General bed mobility comments: quick transition to standing, not  tolerant of sitting pressures to Rt ischium    Transfers Overall transfer level: Needs  assistance Equipment used: 1 person hand held assist Transfers: Sit to/from Stand Sit to Stand: Min assist                Ambulation/Gait Ambulation/Gait assistance: Min guard Gait Distance (Feet): 3 Feet Assistive device: Rolling walker (2 wheels) Gait Pattern/deviations: WFL(Within Functional Limits)       General Gait Details: attempts fwd AMB to counter, begins to have cramping in LE, cold sweat, queaziness  Stairs            Wheelchair Mobility    Modified Rankin (Stroke Patients Only)       Balance                                             Pertinent Vitals/Pain Pain Assessment Pain Assessment: Faces Faces Pain Scale: Hurts whole lot Pain Location: Rt posterior ischael tuberosity Pain Descriptors / Indicators: Discomfort, Grimacing, Aching, Constant Pain Intervention(s): Limited activity within patient's tolerance, Premedicated before session    Home Living Family/patient expects to be discharged to:: Private residence Living Arrangements: Spouse/significant other Available Help at Discharge: Family Type of Home: House Home Access: Stairs to enter Entrance Stairs-Rails: None Technical brewer of Steps: 2   Home Layout: One level Home Equipment: Cane - single point      Prior Function Prior Level of Function : Independent/Modified Independent             Mobility Comments: no AD use; last fall in April 23       Hand Dominance        Extremity/Trunk Assessment   Upper Extremity Assessment Upper Extremity Assessment: Overall WFL for tasks assessed    Lower Extremity Assessment Lower Extremity Assessment: Generalized weakness       Communication   Communication: No difficulties  Cognition Arousal/Alertness: Awake/alert Behavior During Therapy: WFL for tasks assessed/performed Overall Cognitive Status: Within Functional Limits for tasks assessed                                           General Comments      Exercises General Exercises - Lower Extremity Hip ABduction/ADduction: AROM, Right, 10 reps, Supine, Limitations Hip Abduction/Adduction Limitations: does not aggravate pain Straight Leg Raises: AROM, Right, Supine, Limitations Straight Leg Raises Limitations: does not aggravate pain Hip Flexion/Marching: AAROM, Right, 5 reps, Limitations Hip Flexion/Marching Limitations: increased symptoms provocation with hip flexion angle   Assessment/Plan    PT Assessment Patient needs continued PT services  PT Problem List Decreased activity tolerance;Decreased mobility       PT Treatment Interventions DME instruction;Gait training;Stair training;Functional mobility training;Therapeutic activities;Therapeutic exercise;Balance training;Neuromuscular re-education;Patient/family education;Manual techniques    PT Goals (Current goals can be found in the Care Plan section)  Acute Rehab PT Goals Patient Stated Goal: tolerate sitting, not have more LOC or dizziness PT Goal Formulation: With patient Time For Goal Achievement: 10/02/22 Potential to Achieve Goals: Good    Frequency Min 2X/week     Co-evaluation               AM-PAC PT "6 Clicks" Mobility  Outcome Measure Help needed turning from your back to your side while in a flat bed  without using bedrails?: A Lot Help needed moving from lying on your back to sitting on the side of a flat bed without using bedrails?: A Lot Help needed moving to and from a bed to a chair (including a wheelchair)?: A Lot Help needed standing up from a chair using your arms (e.g., wheelchair or bedside chair)?: A Lot Help needed to walk in hospital room?: A Lot Help needed climbing 3-5 steps with a railing? : A Lot 6 Click Score: 12    End of Session   Activity Tolerance: Patient limited by fatigue;Treatment limited secondary to medical complications (Comment) Patient left: in bed;with family/visitor present;Other (comment)  (HOB 30 degrees) Nurse Communication: Mobility status PT Visit Diagnosis: Difficulty in walking, not elsewhere classified (R26.2);Other symptoms and signs involving the nervous system (R29.898)    Time: 0354-6568 PT Time Calculation (min) (ACUTE ONLY): 22 min   Charges:   PT Evaluation $PT Eval Low Complexity: 1 Low         12:15 PM, 09/18/22 Etta Grandchild, PT, DPT Physical Therapist - [facility]  [phone] ([place])    [place] C 09/18/2022, 12:08 PM

## 2022-09-18 NOTE — Assessment & Plan Note (Signed)
Continue Zoloft 

## 2022-09-18 NOTE — Assessment & Plan Note (Signed)
Case discussed with neurology and he will need outpatient ENT for vestibular training.  Can try low-dose Valium and low-dose meclizine as needed for vertigo.  Advised not to look down and to the right.  PT to work with him again tomorrow.

## 2022-09-18 NOTE — Assessment & Plan Note (Signed)
X-ray negative.  Patient able to straight leg raise so this is likely not a hip fracture I think it is due to the fall and large hematoma.

## 2022-09-18 NOTE — Progress Notes (Signed)
Progress Note   Patient: [name] MCN:[id number] DOB: 10/06/1930 DOA: 09/17/2022     0 DOS: the patient was seen and examined on 09/18/2022   Brief hospital course: 89 year old male with past medical history of vertigo, hyperlipidemia, type 2 diabetes mellitus.  He states he got up to go to the bathroom and sat down.  He looked to the right and down and ended up on the ground he fell and hurt his hip.  He has a large bruise on his hip and having quite a bit of pain there. 1/9.  He worked with physical therapy more with walking and was limited with his hip and they did not do the vestibular training.  Assessment and Plan: * Vertigo Case discussed with neurology and he will need outpatient ENT for vestibular training.  Can try low-dose Valium and low-dose meclizine as needed for vertigo.  Advised not to look down and to the right.  PT to work with him again tomorrow.  Stenosis of intracranial vessel Case discussed with neurology no intervention needed.  Aspirin and Lipitor.  Anxiety Continue Zoloft  Right hip pain X-ray negative.  Patient able to straight leg raise so this is likely not a hip fracture I think it is due to the fall and large hematoma.  Syncope Check orthostatics.  Likely secondary to vertigo.            Subjective: Patient states that he has vertigo that has not improved.  He has taken meclizine in the past but has not helped.  He states that when he turns his head to the right and looks down he gets it.  He had 2 falls and fell and hit his right hip and a lot of bruising there and a lot of pain.  Physical Exam: Vitals:   09/18/22 0100 09/18/22 0142 09/18/22 0343 09/18/22 0900  BP: (!) 112/59 128/75 (!) 144/72 136/72  Pulse: 81 84 74 76  Resp: '20 16 16 16  '$ Temp:  98.6 F (37 C) 97.6 F (36.4 C) 97.6 F (36.4 C)  TempSrc:  Oral Oral Oral  SpO2: 96% 97% 97% 98%  Weight:   68 kg   Height:   '5\' 10"'$  (1.778 m)    Physical Exam HENT:     Head:  Normocephalic.     Mouth/Throat:     Pharynx: No oropharyngeal exudate.  Eyes:     General: Lids are normal.     Conjunctiva/sclera: Conjunctivae normal.  Cardiovascular:     Rate and Rhythm: Normal rate and regular rhythm.     Heart sounds: Normal heart sounds, S1 normal and S2 normal.  Pulmonary:     Breath sounds: No decreased breath sounds, wheezing, rhonchi or rales.  Abdominal:     Palpations: Abdomen is soft.     Tenderness: There is no abdominal tenderness.  Musculoskeletal:     Right lower leg: No swelling.     Left lower leg: No swelling.  Skin:    General: Skin is warm.     Findings: No rash.     Comments: Large bruise lower right buttock hip area laterally  Neurological:     Mental Status: He is alert and oriented to person, place, and time.     Comments: Able to straight leg raise bilaterally discomfort when doing his right leg.     Data Reviewed: Sodium 134, LDL 75, hemoglobin 12.5, white blood cell count 14.0  Family Communication: Spoke with daughter at the bedside  Disposition:  Status is: Inpatient Remains inpatient appropriate because: Physical therapy worked with the strength today and would to do some vestibular training.  Planned Discharge Destination: Home with Home Health    Time spent: 28 minutes  Author: Loletha Grayer, MD 09/18/2022 3:50 PM  For on call review [email or web address].

## 2022-09-18 NOTE — Evaluation (Signed)
Occupational Therapy Evaluation Patient Details Name: [name] MRN: [id number] DOB: 07-30-1931 Today's Date: 09/18/2022   History of Present Illness [name] is an 61yoM who comes to [facility] on 09/17/22 after 2 episodes of dizziness weakness, which included a fall and episodic vertigo. PMH: anemia, HLD, DM. Has been evaluated by PCP, ENT in past month including a negative MRI brain. Mechanical fall at home in April 2023 with subsequent suspected full thickness tear of Left rotator cuff.   Clinical Impression   [name] was seen for OT evaluation this date. Prior to hospital admission, pt was IND. Pt lives with spouse in home c 2 STE. Pt presents to acute OT demonstrating impaired ADL performance and functional mobility 2/2 decreased activity tolerance and functional strength/ROM/balance deficits. Pt currently requires MIN A for bed mobility, reports 10/10 R hip pain in sitting, tolerates <1 min static sitting. Defers further mobility 2/2 pain, RN notified. Vertigo symptoms noted with R head turns at bed level, educated on exiting bed to L side with nursing. Pt would benefit from skilled OT to address noted impairments. Upon hospital discharge, recommend STR, however pt may improve pending further mobility assessment and vertigo symptom mgmt.    Recommendations for follow up therapy are one component of a multi-disciplinary discharge planning process, led by the attending physician.  Recommendations may be updated based on patient status, additional functional criteria and insurance authorization.   Follow Up Recommendations  Skilled nursing-short term rehab (<3 hours/day)     Assistance Recommended at Discharge Frequent or constant Supervision/Assistance  Patient can return home with the following A little help with walking and/or transfers;A little help with bathing/dressing/bathroom;Help with stairs or ramp for entrance    Functional Status Assessment  Patient has had a recent decline in their  functional status and demonstrates the ability to make significant improvements in function in a reasonable and predictable amount of time.  Equipment Recommendations  BSC/3in1    Recommendations for Other Services       Precautions / Restrictions Precautions Precautions: Fall Restrictions Weight Bearing Restrictions: No      Mobility Bed Mobility Overal bed mobility: Needs Assistance Bed Mobility: Supine to Sit, Sit to Supine     Supine to sit: Min assist Sit to supine: Min assist        Transfers                   General transfer comment: pt unable to tolerate attempt citing pain      Balance Overall balance assessment: Needs assistance Sitting-balance support: Bilateral upper extremity supported, Feet supported Sitting balance-Leahy Scale: Poor   Postural control: Left lateral lean                                 ADL either performed or assessed with clinical judgement   ADL Overall ADL's : Needs assistance/impaired                                       General ADL Comments: SETUP bed level grooming tasks. MIN A seated grooming tasks. MAX A for LB access bed level      Pertinent Vitals/Pain Pain Assessment Pain Assessment: 0-10 Pain Score: 10-Worst pain ever Pain Location: R hip Pain Descriptors / Indicators: Discomfort, Grimacing, Aching, Constant Pain Intervention(s): Limited activity within patient's tolerance, Patient  requesting pain meds-RN notified     Hand Dominance     Extremity/Trunk Assessment Upper Extremity Assessment Upper Extremity Assessment: Overall WFL for tasks assessed   Lower Extremity Assessment Lower Extremity Assessment: Generalized weakness       Communication Communication Communication: No difficulties   Cognition Arousal/Alertness: Awake/alert Behavior During Therapy: WFL for tasks assessed/performed Overall Cognitive Status: Within Functional Limits for tasks assessed                                                   Home Living Family/patient expects to be discharged to:: Private residence Living Arrangements: Spouse/significant other Available Help at Discharge: Family Type of Home: House Home Access: Stairs to enter Technical brewer of Steps: 2 Entrance Stairs-Rails: None Home Layout: One level               Home Equipment: Cane - single point          Prior Functioning/Environment Prior Level of Function : Independent/Modified Independent             Mobility Comments: no AD use; last fall in April 23          OT Problem List: Decreased strength;Decreased range of motion;Decreased activity tolerance;Impaired balance (sitting and/or standing);Decreased safety awareness      OT Treatment/Interventions: Self-care/ADL training;Therapeutic exercise;Energy conservation;DME and/or AE instruction;Therapeutic activities;Patient/family education;Balance training    OT Goals(Current goals can be found in the care plan section) Acute Rehab OT Goals Patient Stated Goal: to return to PLOF OT Goal Formulation: With patient Time For Goal Achievement: 10/02/22 Potential to Achieve Goals: Good ADL Goals Pt Will Perform Grooming: with min assist;standing Pt Will Perform Lower Body Dressing: with min assist;sit to/from stand Pt Will Transfer to Toilet: with min assist;ambulating;regular height toilet  OT Frequency: Min 2X/week    Co-evaluation              AM-PAC OT "6 Clicks" Daily Activity     Outcome Measure Help from another person eating meals?: None Help from another person taking care of personal grooming?: A Little Help from another person toileting, which includes using toliet, bedpan, or urinal?: A Little Help from another person bathing (including washing, rinsing, drying)?: A Lot Help from another person to put on and taking off regular upper body clothing?: A Little Help from another person to  put on and taking off regular lower body clothing?: A Lot 6 Click Score: 17   End of Session Nurse Communication: Patient requests pain meds  Activity Tolerance: Patient limited by pain Patient left: in bed;with call Virani/phone within reach  OT Visit Diagnosis: Other abnormalities of gait and mobility (R26.89);Muscle weakness (generalized) (M62.81)                Time: 9774-1423 OT Time Calculation (min): 16 min Charges:  OT General Charges $OT Visit: 1 Visit OT Evaluation $OT Eval Low Complexity: 1 Low  Dessie Coma, M.S. OTR/L  09/18/22, 10:04 AM  ascom [phone]

## 2022-09-19 DIAGNOSIS — R42 Dizziness and giddiness: Secondary | ICD-10-CM | POA: Diagnosis not present

## 2022-09-19 LAB — ECHOCARDIOGRAM COMPLETE
AR max vel: 1.46 cm2
AV Area VTI: 1.57 cm2
AV Area mean vel: 1.31 cm2
AV Mean grad: 18.7 mmHg
AV Peak grad: 31.4 mmHg
Ao pk vel: 2.8 m/s
Area-P 1/2: 3.21 cm2
Height: 70 in
S' Lateral: 3 cm
Weight: 2398.6 oz

## 2022-09-19 LAB — CBC
HCT: 33.2 % — ABNORMAL LOW (ref 39.0–52.0)
Hemoglobin: 11.2 g/dL — ABNORMAL LOW (ref 13.0–17.0)
MCH: 29.9 pg (ref 26.0–34.0)
MCHC: 33.7 g/dL (ref 30.0–36.0)
MCV: 88.5 fL (ref 80.0–100.0)
Platelets: 216 10*3/uL (ref 150–400)
RBC: 3.75 MIL/uL — ABNORMAL LOW (ref 4.22–5.81)
RDW: 12 % (ref 11.5–15.5)
WBC: 13.2 10*3/uL — ABNORMAL HIGH (ref 4.0–10.5)
nRBC: 0 % (ref 0.0–0.2)

## 2022-09-19 LAB — GLUCOSE, CAPILLARY: Glucose-Capillary: 121 mg/dL — ABNORMAL HIGH (ref 70–99)

## 2022-09-19 LAB — BASIC METABOLIC PANEL
Anion gap: 8 (ref 5–15)
BUN: 12 mg/dL (ref 8–23)
CO2: 26 mmol/L (ref 22–32)
Calcium: 8.8 mg/dL — ABNORMAL LOW (ref 8.9–10.3)
Chloride: 99 mmol/L (ref 98–111)
Creatinine, Ser: 0.67 mg/dL (ref 0.61–1.24)
GFR, Estimated: >60 mL/min (ref >60)
Glucose, Bld: 140 mg/dL — ABNORMAL HIGH (ref 70–99)
Potassium: 4 mmol/L (ref 3.5–5.1)
Sodium: 133 mmol/L — ABNORMAL LOW (ref 135–145)

## 2022-09-19 MED ORDER — MECLIZINE HCL 25 MG PO TABS
12.5000 mg | ORAL_TABLET | Freq: Three times a day (TID) | ORAL | Status: DC
  Administered 2022-09-19 – 2022-09-24 (×16): 12.5 mg via ORAL
  Filled 2022-09-19 (×17): qty 0.5

## 2022-09-19 MED ORDER — ADULT MULTIVITAMIN W/MINERALS CH
1.0000 | ORAL_TABLET | Freq: Every day | ORAL | Status: DC
  Administered 2022-09-20 – 2022-09-24 (×5): 1 via ORAL
  Filled 2022-09-19 (×5): qty 1

## 2022-09-19 MED ORDER — DIAZEPAM 2 MG PO TABS
1.0000 mg | ORAL_TABLET | Freq: Two times a day (BID) | ORAL | Status: DC
  Administered 2022-09-19 – 2022-09-24 (×11): 1 mg via ORAL
  Filled 2022-09-19 (×12): qty 1

## 2022-09-19 MED ORDER — VITAMIN C 500 MG PO TABS
250.0000 mg | ORAL_TABLET | Freq: Two times a day (BID) | ORAL | Status: DC
  Administered 2022-09-19 – 2022-09-24 (×10): 250 mg via ORAL
  Filled 2022-09-19 (×10): qty 1

## 2022-09-19 MED ORDER — GLUCERNA SHAKE PO LIQD
237.0000 mL | Freq: Three times a day (TID) | ORAL | Status: DC
  Administered 2022-09-19 – 2022-09-24 (×13): 237 mL via ORAL

## 2022-09-19 NOTE — Progress Notes (Signed)
   09/19/22 0200  What Happened  Was fall witnessed? No  Was patient injured? No  Patient found on floor  Found by Staff-comment Water quality scientist)  Stated prior activity to/from bed, chair, or stretcher  Provider Notification  Provider Name/Title Neomia Glass NP  Date Provider Notified 09/19/22  Time Provider Notified 0236  Method of Notification Page  Notification Reason Fall  Follow Up  Family notified No - patient refusal (middle of night)  Additional tests No  Progress note created (see row info) Yes  Vitals  BP (!) 163/97  MAP (mmHg) 117  BP Location Left Arm  BP Method Automatic  Patient Position (if appropriate) Lying  Pulse Rate 96  Oxygen Therapy  SpO2 100 %  O2 Device Room Air  Neurological  Neuro (WDL) X  Level of Consciousness Alert  Orientation Level Oriented to person;Disoriented to place     Patient was yelling for help. Staff found patient on the floor beside bed with fall mat in place. Patient states he did not fall but that he was worried he would fall so he just "slid down to the floor". VSS no visible injuries noted. Neomia Glass NP notified of unwitnessed fall.

## 2022-09-19 NOTE — Progress Notes (Signed)
       CROSS COVER NOTE  NAME: [name] MRN: [id number] DOB : Jan 26, 1931 ATTENDING PHYSICIAN: Loletha Grayer, MD    Notified by nursing that [name] had an unwitnessed fall.  [name] is admitted post fall with Vertigo. He is not reporting any pain and there is no visible sign of injury according to nursing. [name] reports he did not hit his head.  No further investigative work-up necessary at this time we will continue to monitor and consider testing if patient begins to exhibit any symptoms of pain or change from neurological baseline.   To reach the provider On-Call:   7AM- 7PM see care teams to locate the attending and reach out to them via [email or web address]. 7PM-7AM contact night-coverage If you still have difficulty reaching the appropriate provider, please page the [facility] (Director on Call) for [facility] on amion for assistance  This document was prepared using Set designer software and may include unintentional dictation errors.  Neomia Glass DNP, MBA, FNP-BC Nurse Practitioner [facility] Pager [phone]

## 2022-09-19 NOTE — Plan of Care (Signed)

## 2022-09-19 NOTE — TOC Progression Note (Signed)
Transition of Care [facility]) - Progression Note    Patient Details  Name: [name] MRN: [id number] Date of Birth: 28-Nov-1930  Transition of Care [facility]) CM/SW [place], LCSW Phone Number: 09/19/2022, 11:34 AM  Clinical Narrative:   Met with patient, wife, and daughter. Home health preference is [facility]. They have accepted referral for PT and OT. Daughter confirmed they have a RW, shower chair, and handheld shower head at home. They do not have BSC. Will order 3-in-1.  Expected Discharge Plan: [place] Barriers to Discharge: Continued Medical Work up  Expected Discharge Plan and Services     Post Acute Care Choice: [place] arrangements for the past 2 months: Single Family Home                                       Social Determinants of Health (SDOH) Interventions SDOH Screenings   Food Insecurity: No Food Insecurity (09/18/2022)  Housing: Low Risk  (09/18/2022)  Transportation Needs: No Transportation Needs (09/18/2022)  Utilities: Not At Risk (09/18/2022)  Tobacco Use: Medium Risk (09/18/2022)    Readmission Risk Interventions     No data to display

## 2022-09-19 NOTE — TOC CM/SW Note (Signed)
Patient is not able to walk the distance required to go the bathroom, or he/she is unable to safely negotiate stairs required to access the bathroom.  A 3in1 BSC will alleviate this problem  

## 2022-09-19 NOTE — Progress Notes (Signed)
Progress Note   Patient: [name] SWN:[id number] DOB: 03/18/1931 DOA: 09/17/2022     1 DOS: the patient was seen and examined on 09/19/2022   Brief hospital course: 89 year old male with past medical history of vertigo, hyperlipidemia, type 2 diabetes mellitus.  He states he got up to go to the bathroom and sat down.  He looked to the right and down and ended up on the ground he fell and hurt his hip.  He has a large bruise on his hip and having quite a bit of pain there. 1/9.  He worked with physical therapy more with walking and was limited with his hip and they did not do the vestibular training.  1/10.  Patient tried to get up overnight and had an unwitnessed fall.  Patient still continues to have significant vertigo.  Patient did not receive even 1 dose of Valium or meclizine yesterday.  Assessment and Plan: * Vertigo Case discussed with neurology and he will need outpatient ENT for vestibular training.  Can try low-dose Valium and low-dose meclizine as needed for vertigo.  Advised not to look down and to the right.  Patient had unwitnessed fall overnight.  Patient still having significant vertigo.  Did not receive even 1 dose of Valium or meclizine yesterday.  Currently I have scheduled the medication and decrease the dose of Valium.  Will have to wait and see how the patient responds to the medication.  Discontinued Percocet as patient does not have any pain and this is causing him more discomfort with his vertigo.  PT to work with him today.  Stenosis of intracranial vessel Case discussed with neurology no intervention needed.  Aspirin and Lipitor.  Anxiety Continue Zoloft  Right hip pain X-ray negative.  Patient able to straight leg raise so this is likely not a hip fracture I think it is due to the fall and large hematoma.  Syncope Check orthostatics.  Likely secondary to vertigo.            Subjective: Patient seen and examined at bedside today.  Patient had an  unwitnessed fall this morning.  Patient still has significant vertigo.    Physical Exam: Vitals:   09/18/22 2025 09/19/22 0200 09/19/22 0341 09/19/22 0739  BP: 97/68 (!) 163/97 127/78 130/84  Pulse: 98 96 95 88  Resp: '18  19 20  '$ Temp: 98 F (36.7 C)  98.2 F (36.8 C) 98.4 F (36.9 C)  TempSrc:    Oral  SpO2: 95% 100% 95% 95%  Weight:      Height:       Physical Exam HENT:     Head: Normocephalic.     Mouth/Throat:     Pharynx: No oropharyngeal exudate.  Eyes:     General: Lids are normal.     Conjunctiva/sclera: Conjunctivae normal.  Cardiovascular:     Rate and Rhythm: Normal rate and regular rhythm.     Heart sounds: Normal heart sounds, S1 normal and S2 normal.  Pulmonary:     Breath sounds: No decreased breath sounds, wheezing, rhonchi or rales.  Abdominal:     Palpations: Abdomen is soft.     Tenderness: There is no abdominal tenderness.  Musculoskeletal:     Right lower leg: No swelling.     Left lower leg: No swelling.  Skin:    General: Skin is warm.     Findings: No rash.     Comments: Large bruise lower right buttock hip area laterally  Neurological:  Mental Status: He is alert and oriented to person, place, and time.     Comments: Able to straight leg raise bilaterally discomfort when doing his right leg.     Data Reviewed: Sodium 134, LDL 75, hemoglobin 12.5, white blood cell count 14.0  Family Communication: Spoke with daughter at the bedside  Disposition: Status is: Inpatient Remains inpatient appropriate because: Physical therapy worked with the strength today and would to do some vestibular training.  Planned Discharge Destination: Home with Home Health    Time spent: 28 minutes  Author: Carlyle Lipa, MD 09/19/2022 2:48 PM  For on call review [email or web address].

## 2022-09-19 NOTE — Progress Notes (Signed)
Initial Nutrition Assessment  DOCUMENTATION CODES:   Severe malnutrition in context of social or environmental circumstances  INTERVENTION:   Glucerna Shake po TID, each supplement provides 220 kcal and 10 grams of protein  MVI po daily   Vitamin C '250mg'$  po BID  Liberalize diet   Check vitamin B12 and vitamin D labs   NUTRITION DIAGNOSIS:   Severe Malnutrition related to social / environmental circumstances (advanced age) as evidenced by severe fat depletion, severe muscle depletion.  GOAL:   Patient will meet greater than or equal to 90% of their needs  MONITOR:   PO intake, Supplement acceptance, Labs, Weight trends, Skin, I & O's  REASON FOR ASSESSMENT:   Rounds    ASSESSMENT:   89 y.o. male with medical history significant of vertigo, hyperlipidemia, anxiety and diabetes who is admitted with syncope, fall and dizziness.  Met with pt in room today. Pt reports fairly good appetite and oral intake at baseline but reports that he has been having weakness and falls lately. Pt is documented to be eating 50-75% of meals in hospital. RD discussed with pt the importance of adequate nutrition needed to preserve lean muscle. Pt reports that he does not like Ensure because its too sweet; pt is willing to try a less sweet supplement (chocolate). Pt is likely at refeed risk. Per chart, pt appears weight stable pta. RD will check vitamin labs as pt reports weakness.   Medications reviewed and include: aspirin, oscal, lovenox, prosight  Labs reviewed: Na 133(L), K 4.0 wnl Wbc- 13.2(H)  NUTRITION - FOCUSED PHYSICAL EXAM:  Flowsheet Row Most Recent Value  Orbital Region Moderate depletion  Upper Arm Region Severe depletion  Thoracic and Lumbar Region Severe depletion  Buccal Region Moderate depletion  Temple Region Severe depletion  Clavicle Bone Region Severe depletion  Clavicle and Acromion Bone Region Severe depletion  Scapular Bone Region Severe depletion  Dorsal Hand  Severe depletion  Patellar Region Severe depletion  Anterior Thigh Region Severe depletion  Posterior Calf Region Severe depletion  Edema (RD Assessment) Mild  Hair Reviewed  Eyes Reviewed  Mouth Reviewed  Skin Reviewed  Nails Reviewed   Diet Order:   Diet Order             Diet Heart Room service appropriate? Yes; Fluid consistency: Thin  Diet effective now                  EDUCATION NEEDS:   Education needs have been addressed  Skin:  Skin Assessment: Reviewed RN Assessment (ecchymosis)  Last BM:  1/7  Height:   Ht Readings from Last 1 Encounters:  09/18/22 '5\' 10"'$  (1.778 m)    Weight:   Wt Readings from Last 1 Encounters:  09/18/22 68 kg    Ideal Body Weight:  75.45 kg  BMI:  Body mass index is 21.51 kg/m.  Estimated Nutritional Needs:   Kcal:  1800-2100kcal/day  Protein:  90-105g/day  Fluid:  1.7-2.0L/day  Koleen Distance MS, RD, LDN Please refer to [facility] for RD and/or RD on-call/weekend/after hours pager

## 2022-09-20 ENCOUNTER — Inpatient Hospital Stay: Payer: Medicare HMO

## 2022-09-20 DIAGNOSIS — E43 Unspecified severe protein-calorie malnutrition: Secondary | ICD-10-CM | POA: Insufficient documentation

## 2022-09-20 DIAGNOSIS — R42 Dizziness and giddiness: Secondary | ICD-10-CM | POA: Diagnosis not present

## 2022-09-20 LAB — MAGNESIUM: Magnesium: 2 mg/dL (ref 1.7–2.4)

## 2022-09-20 LAB — PHOSPHORUS: Phosphorus: 3.4 mg/dL (ref 2.5–4.6)

## 2022-09-20 LAB — VITAMIN B12: Vitamin B-12: 642 pg/mL (ref 180–914)

## 2022-09-20 NOTE — Progress Notes (Signed)
Patient was not able to tolerate twenty (20) seconds of the standing attempt of orthostatics. He became symptomatic ( clammy, nausea, and jelly legs.) unable to complete.   09/20/22 1507  Vitals  Temp 97.7 F (36.5 C)  Temp Source Oral  BP 124/64  MAP (mmHg) 83  BP Location Left Arm  BP Method Automatic  Patient Position (if appropriate) Lying  Pulse Rate 85  Pulse Rate Source Dinamap  Resp 20  MEWS COLOR  MEWS Score Color Green  Orthostatic Lying   BP- Lying 124/64  Pulse- Lying 85  Orthostatic Sitting  BP- Sitting 90/57  Pulse- Sitting 96  Oxygen Therapy  SpO2 95 %  O2 Device Room Air  MEWS Score  MEWS Temp 0  MEWS Systolic 0  MEWS Pulse 0  MEWS RR 0  MEWS LOC 0  MEWS Score 0

## 2022-09-20 NOTE — TOC Progression Note (Addendum)
Transition of Care [facility]) - Progression Note    Patient Details  Name: ABDULMALIK DARCO MRN: [id number] Date of Birth: 26-Aug-1931  Transition of Care [facility]) CM/SW [place], LCSW Phone Number: 09/20/2022, 10:52 AM  Clinical Narrative:  Ordered 3-in-1 through [employer].   1:24 pm: After 3-in-1 was delivered to the room, patient decided he didn't want one. [employer] liaison will come back and pick it up.  Expected Discharge Plan: [place] Barriers to Discharge: Continued Medical Work up  Expected Discharge Plan and Services     Post Acute Care Choice: [place] arrangements for the past 2 months: Single Family Home                                       Social Determinants of Health (SDOH) Interventions SDOH Screenings   Food Insecurity: No Food Insecurity (09/18/2022)  Housing: Low Risk  (09/18/2022)  Transportation Needs: No Transportation Needs (09/18/2022)  Utilities: Not At Risk (09/18/2022)  Tobacco Use: Medium Risk (09/18/2022)    Readmission Risk Interventions     No data to display

## 2022-09-20 NOTE — Progress Notes (Addendum)
Physical Therapy Treatment Patient Details Name: [name] MRN: [id number] DOB: 1930/10/12 Today's Date: 09/20/2022   History of Present Illness [name] is an 21yoM who comes to [facility] on 09/17/22 after 2 episodes of dizziness weakness, which included a fall and episodic vertigo. PMH: anemia, HLD, DM. Has been evaluated by PCP, ENT in past month including a negative MRI brain. Mechanical fall at home in April 2023 with subsequent suspected full thickness tear of Left rotator cuff.    PT Comments    Pt is making gradual progress towards goals and reports hip pain is improved. Has been ambulatory to bathroom with RN staff. Agreeable to focus treatment session on BPPV and performed Epley maneuver and repositioning. Pt with heavy nystagmus R upbeating, however dissipates within 1 minute. Educated to continue mobility efforts with Conservation officer, historic buildings. Of note + unwitnessed fall on 09/19/21. Will continue to progress.  Recommendations for follow up therapy are one component of a multi-disciplinary discharge planning process, led by the attending physician.  Recommendations may be updated based on patient status, additional functional criteria and insurance authorization.  Follow Up Recommendations  Home health PT     Assistance Recommended at Discharge Intermittent Supervision/Assistance  Patient can return home with the following A little help with walking and/or transfers;A little help with bathing/dressing/bathroom;Assistance with cooking/housework;Help with stairs or ramp for entrance;Assist for transportation;Direct supervision/assist for financial management;Direct supervision/assist for medications management   Equipment Recommendations  Rolling walker (2 wheels)    Recommendations for Other Services       Precautions / Restrictions Precautions Precautions: Fall Restrictions Weight Bearing Restrictions: No     Mobility  Bed Mobility Overal bed mobility: Needs Assistance Bed Mobility: Supine  to Sit, Sit to Supine     Supine to sit: Min assist Sit to supine: Min assist   General bed mobility comments: needs assist for trunkal elevation. Once seated at EOB, dizziness noted, however disapates quickly    Transfers Overall transfer level: Needs assistance Equipment used: Rolling walker (2 wheels) Transfers: Sit to/from Stand Sit to Stand: Min assist           General transfer comment: able to stand at bedside. RW used    Ambulation/Gait               General Gait Details: deferred secondary to just finished ambulating with OT   Stairs             Wheelchair Mobility    Modified Rankin (Stroke Patients Only)       Balance Overall balance assessment: Needs assistance Sitting-balance support: Bilateral upper extremity supported, Feet supported Sitting balance-Leahy Scale: Good     Standing balance support: Bilateral upper extremity supported Standing balance-Leahy Scale: Fair                              Cognition Arousal/Alertness: Awake/alert Behavior During Therapy: WFL for tasks assessed/performed Overall Cognitive Status: Within Functional Limits for tasks assessed                                          Exercises Other Exercises Other Exercises: performed Epley manuever for R ear. Rated 9/10 dizziness. Educated Conservation officer, historic buildings to attempt orthostatics this shift    General Comments        Pertinent Vitals/Pain Pain Assessment Pain Assessment: Faces Faces  Pain Scale: Hurts little more Pain Location: R hip Pain Descriptors / Indicators: Discomfort, Grimacing, Aching, Constant Pain Intervention(s): Limited activity within patient's tolerance    Home Living                          Prior Function            PT Goals (current goals can now be found in the care plan section) Acute Rehab PT Goals Patient Stated Goal: tolerate sitting, not have more LOC or dizziness PT Goal Formulation: With  patient Time For Goal Achievement: 10/02/22 Potential to Achieve Goals: Good Progress towards PT goals: Progressing toward goals    Frequency    Min 2X/week      PT Plan Current plan remains appropriate    Co-evaluation              AM-PAC PT "6 Clicks" Mobility   Outcome Measure  Help needed turning from your back to your side while in a flat bed without using bedrails?: A Little Help needed moving from lying on your back to sitting on the side of a flat bed without using bedrails?: A Little Help needed moving to and from a bed to a chair (including a wheelchair)?: A Little Help needed standing up from a chair using your arms (e.g., wheelchair or bedside chair)?: A Little Help needed to walk in hospital room?: A Lot Help needed climbing 3-5 steps with a railing? : A Lot 6 Click Score: 16    End of Session Equipment Utilized During Treatment: Gait belt Activity Tolerance: Patient limited by fatigue Patient left: in bed;with bed alarm set Nurse Communication: Mobility status PT Visit Diagnosis: Difficulty in walking, not elsewhere classified (R26.2);Other symptoms and signs involving the nervous system (R29.898)     Time: 9373-4287 PT Time Calculation (min) (ACUTE ONLY): 26 min  Charges:  $Therapeutic Activity: 8-22 mins $[place] Rep Proc: 8-22 mins                     [name], PT, DPT, GCS [phone]    [name] 09/20/2022, 1:50 PM

## 2022-09-20 NOTE — Progress Notes (Signed)
Occupational Therapy Treatment Patient Details Name: [name] MRN: [id number] DOB: 04/26/1931 Today's Date: 09/20/2022   History of present illness Cage Gupton is an 55yoM who comes to [facility] on 09/17/22 after 2 episodes of dizziness weakness, which included a fall and episodic vertigo. PMH: anemia, HLD, DM. Has been evaluated by PCP, ENT in past month including a negative MRI brain. Mechanical fall at home in April 2023 with subsequent suspected full thickness tear of Left rotator cuff.   OT comments  Mr Handler was seen for OT treatment on this date. Upon arrival to room pt reclined in bed, agreeable to tx. Pt requires  MIN A exit bed, MIN A + RW sit<>stand - assist to stabilize RW. Tolerates ~5 ft, distance limited by dizziness. Pt making good progress toward goals, will continue to follow POC. Discharge recommendation updated to reflect pt progress.     Recommendations for follow up therapy are one component of a multi-disciplinary discharge planning process, led by the attending physician.  Recommendations may be updated based on patient status, additional functional criteria and insurance authorization.    Follow Up Recommendations  Home health OT     Assistance Recommended at Discharge Intermittent Supervision/Assistance  Patient can return home with the following  A little help with walking and/or transfers;A little help with bathing/dressing/bathroom;Help with stairs or ramp for entrance   Equipment Recommendations  BSC/3in1    Recommendations for Other Services      Precautions / Restrictions Precautions Precautions: Fall Restrictions Weight Bearing Restrictions: No       Mobility Bed Mobility Overal bed mobility: Needs Assistance Bed Mobility: Supine to Sit, Sit to Supine     Supine to sit: Min assist Sit to supine: Min guard        Transfers Overall transfer level: Needs assistance Equipment used: Rolling walker (2 wheels) Transfers: Sit to/from Stand Sit to  Stand: Min assist                 Balance Overall balance assessment: Needs assistance Sitting-balance support: Bilateral upper extremity supported, Feet supported Sitting balance-Leahy Scale: Good     Standing balance support: Bilateral upper extremity supported Standing balance-Leahy Scale: Fair                             ADL either performed or assessed with clinical judgement   ADL Overall ADL's : Needs assistance/impaired                                       General ADL Comments: MIN A + RW simulated BSC t/f - assist to stabilize RW, distance limited by dizziness standing.      Cognition Arousal/Alertness: Awake/alert Behavior During Therapy: WFL for tasks assessed/performed Overall Cognitive Status: Within Functional Limits for tasks assessed                                                     Pertinent Vitals/ Pain       Pain Assessment Pain Assessment: 0-10 Pain Score: 5  Pain Location: R hip Pain Descriptors / Indicators: Discomfort, Grimacing, Aching, Constant Pain Intervention(s): Limited activity within patient's tolerance, Repositioned   Frequency  Min 2X/week  Progress Toward Goals  OT Goals(current goals can now be found in the care plan section)  Progress towards OT goals: Progressing toward goals  Acute Rehab OT Goals Patient Stated Goal: to go home OT Goal Formulation: With patient Time For Goal Achievement: 10/02/22 Potential to Achieve Goals: Good ADL Goals Pt Will Perform Grooming: with min assist;standing Pt Will Perform Lower Body Dressing: with min assist;sit to/from stand Pt Will Transfer to Toilet: with min assist;ambulating;regular height toilet  Plan Discharge plan needs to be updated;Frequency remains appropriate    Co-evaluation                 AM-PAC OT "6 Clicks" Daily Activity     Outcome Measure   Help from another person eating meals?: None Help  from another person taking care of personal grooming?: A Little Help from another person toileting, which includes using toliet, bedpan, or urinal?: A Little Help from another person bathing (including washing, rinsing, drying)?: A Lot Help from another person to put on and taking off regular upper body clothing?: A Little Help from another person to put on and taking off regular lower body clothing?: A Lot 6 Click Score: 17    End of Session    OT Visit Diagnosis: Other abnormalities of gait and mobility (R26.89);Muscle weakness (generalized) (M62.81)   Activity Tolerance Patient tolerated treatment well;Patient limited by pain   Patient Left in bed;with call [place]/phone within reach;with bed alarm set;with nursing/sitter in room   Nurse Communication          Time: 5697-9480 OT Time Calculation (min): 12 min  Charges: OT General Charges $OT Visit: 1 Visit OT Treatments $Self Care/Home Management : 8-22 mins  Dessie Coma, M.S. OTR/L  09/20/22, 12:27 PM  ascom [phone]

## 2022-09-20 NOTE — Progress Notes (Signed)
Progress Note   Patient: NICKI GRACY ZMO:[id number] DOB: 1931/03/11 DOA: 09/17/2022     2 DOS: the patient was seen and examined on 09/20/2022   Brief hospital course: 89 year old male with past medical history of vertigo, hyperlipidemia, type 2 diabetes mellitus.  He states he got up to go to the bathroom and sat down.  He looked to the right and down and ended up on the ground he fell and hurt his hip.  He has a large bruise on his hip and having quite a bit of pain there. 1/9.  He worked with physical therapy more with walking and was limited with his hip and they did not do the vestibular training.  1/10.  Patient tried to get up overnight and had an unwitnessed fall.  Patient still continues to have significant vertigo.  Patient did not receive even 1 dose of Valium or meclizine yesterday.  Assessment and Plan:  89 year old male with history of vertigo, hyperlipidemia, type 2 diabetes mellitus experienced an episode of vertigo, headache on considering this patient was brought to the emergency department.  Patient continues to have dizziness.  CT head without contrast did not show any acute intracranial abnormality.  CT angio of the head and neck showed severe intracranial atherosclerotic disease resulting in moderate to severe stenosis of the left supraclinoid ICA, mild to moderate stenosis of the right supraclinoid ICA on the right, moderate stenosis of the proximal left P2 segment, mild to moderate stenosis of the proximal P2 segment.  Severe vertigo -Patient is experiencing severe nausea, vomiting, dizziness, visual defects. Physical therapy -Considering patient's extensive atherosclerotic disease on the CT angiogram, obtain MRI of the brain to rule out CVA -Continue with high-dose statin, aspirin -Continue cephalexin.  Right hip pain -X-rays are negative for any fracture.  Anxiety -Continue with Zoloft  Recurrent falls -Fall precautions Rule out CVA Continue with physical  therapy, Occupational Therapy             Subjective: Patient seen and examined at bedside today.  Patient had an unwitnessed fall this morning.  Patient still has significant vertigo.    Physical Exam: Vitals:   09/19/22 1636 09/19/22 1959 09/20/22 0323 09/20/22 0809  BP: 128/78 113/67 130/72 (!) 144/79  Pulse: 90 87 80 81  Resp: '20 16 14 18  '$ Temp: 98.4 F (36.9 C) 97.9 F (36.6 C) (!) 97.5 F (36.4 C) 97.7 F (36.5 C)  TempSrc: Oral Oral  Oral  SpO2: 95% 95% 94% 95%  Weight:      Height:       Physical Exam HENT:     Head: Normocephalic.     Right Ear: Tympanic membrane normal.     Mouth/Throat:     Pharynx: No oropharyngeal exudate.  Eyes:     General: Lids are normal.     Conjunctiva/sclera: Conjunctivae normal.  Cardiovascular:     Rate and Rhythm: Normal rate and regular rhythm.     Heart sounds: Normal heart sounds, S1 normal and S2 normal.  Pulmonary:     Breath sounds: No decreased breath sounds, wheezing, rhonchi or rales.  Abdominal:     Palpations: Abdomen is soft.     Tenderness: There is no abdominal tenderness.  Musculoskeletal:     Right lower leg: No swelling.     Left lower leg: No swelling.  Skin:    General: Skin is warm.     Findings: No rash.     Comments: Large bruise lower right buttock hip  area laterally  Neurological:     Mental Status: He is alert and oriented to person, place, and time.     Comments: Able to straight leg raise bilaterally discomfort when doing his right leg.     Data Reviewed: Sodium 134, LDL 75, hemoglobin 12.5, white blood cell count 14.0  Family Communication: Spoke with daughter at the bedside  Disposition: Status is: Inpatient Remains inpatient appropriate because: Physical therapy worked with the strength today and would to do some vestibular training.  Planned Discharge Destination: Home with Home Health    Time spent: 28 minutes  Author: Monica Becton, MD 09/20/2022 11:07 AM  For on  call review [email or web address].

## 2022-09-21 DIAGNOSIS — R42 Dizziness and giddiness: Secondary | ICD-10-CM | POA: Diagnosis not present

## 2022-09-21 MED ORDER — DEXAMETHASONE SODIUM PHOSPHATE 10 MG/ML IJ SOLN
4.0000 mg | Freq: Two times a day (BID) | INTRAMUSCULAR | Status: DC
  Administered 2022-09-21 – 2022-09-24 (×6): 4 mg via INTRAVENOUS
  Filled 2022-09-21 (×6): qty 1

## 2022-09-21 NOTE — Plan of Care (Signed)
  Problem: Health Behavior/Discharge Planning: Goal: Ability to manage health-related needs will improve Outcome: Progressing

## 2022-09-21 NOTE — Progress Notes (Signed)
So regarding Progress Note   Patient: [name] ZTI:[id number] DOB: [birth year] DOA: 09/17/2022     3 DOS: the patient was seen and examined on 09/21/2022   Brief hospital course: 89 year old male with past medical history of vertigo, hyperlipidemia, type 2 diabetes mellitus.  He states he got up to go to the bathroom and sat down.  He looked to the right and down and ended up on the ground he fell and hurt his hip.  He has a large bruise on his hip and having quite a bit of pain there. 1/9.  He worked with physical therapy more with walking and was limited with his hip and they did not do the vestibular training.  1/10.  Patient tried to get up overnight and had an unwitnessed fall.  Patient still continues to have significant vertigo.  Patient did not receive even 1 dose of Valium or meclizine yesterday. 1/11 MRI ordered-no stroke  Assessment and Plan:  89 year old male with history of vertigo, hyperlipidemia, type 2 diabetes mellitus experienced an episode of vertigo, headache on considering this patient was brought to the emergency department.  Patient continues to have dizziness.  CT head without contrast did not show any acute intracranial abnormality.  CT angio of the head and neck showed severe intracranial atherosclerotic disease resulting in moderate to severe stenosis of the left supraclinoid ICA, mild to moderate stenosis of the right supraclinoid ICA on the right, moderate stenosis of the proximal left P2 segment, mild to moderate stenosis of the proximal P2 segment.  Severe vertigo -Patient is experiencing severe nausea, vomiting, dizziness, visual defects. Physical therapy -Considering patient's extensive atherosclerotic disease on the CT angiogram, -MRI of the brain to rule out CVA-negative -Continue with high-dose statin, aspirin -Added Decadron concerning about more dizziness on the right side, concern about vestibular neuritis  Right hip pain -X-rays are negative for any  fracture. -Improving  Anxiety -Continue with Zoloft  Recurrent falls -Fall precautions Rule out CVA Continue with physical therapy, Occupational Therapy             Subjective: Patient seen and examined at bedside today.  Patient had an unwitnessed fall this morning.  Patient still has significant vertigo.    Physical Exam: Vitals:   09/20/22 2009 09/21/22 0305 09/21/22 0836 09/21/22 1537  BP: 122/71 121/68 120/65 120/67  Pulse: 91 88 83 88  Resp: '16 16 16 17  '$ Temp: 98.8 F (37.1 C) 97.9 F (36.6 C) 98.1 F (36.7 C) 98.3 F (36.8 C)  TempSrc: Oral Oral    SpO2: 96% 94% 95% 95%  Weight:      Height:       Physical Exam HENT:     Head: Normocephalic.     Right Ear: Tympanic membrane normal.     Mouth/Throat:     Pharynx: No oropharyngeal exudate.  Eyes:     General: Lids are normal.     Conjunctiva/sclera: Conjunctivae normal.  Cardiovascular:     Rate and Rhythm: Normal rate and regular rhythm.     Heart sounds: Normal heart sounds, S1 normal and S2 normal.  Pulmonary:     Breath sounds: No decreased breath sounds, wheezing, rhonchi or rales.  Abdominal:     Palpations: Abdomen is soft.     Tenderness: There is no abdominal tenderness.  Musculoskeletal:     Right lower leg: No swelling.     Left lower leg: No swelling.  Skin:    General: Skin is warm.  Findings: No rash.     Comments: Large bruise lower right buttock hip area laterally  Neurological:     Mental Status: He is alert and oriented to person, place, and time.     Comments: Able to straight leg raise bilaterally discomfort when doing his right leg.     Data Reviewed: Sodium 134, LDL 75, hemoglobin 12.5, white blood cell count 14.0  Family Communication: Spoke with daughter at the bedside  Disposition: Status is: Inpatient Remains inpatient appropriate because: Physical therapy worked with the strength today and would to do some vestibular training.  Planned Discharge Destination:  Home with Home Health    Time spent: 28 minutes  Author: Monica Becton, MD 09/21/2022 3:50 PM  For on call review [email or web address].  Progress Note   Patient: [name] [id number] DOB: 12/27/1930 DOA: 09/17/2022     3 DOS: the patient was seen and examined on 09/21/2022   Brief hospital course: No notes on file  Assessment and Plan: * Vertigo Case discussed with neurology and he will need outpatient ENT for vestibular training.  Can try low-dose Valium and low-dose meclizine as needed for vertigo.  Advised not to look down and to the right.  PT to work with him again tomorrow.  Stenosis of intracranial vessel Case discussed with neurology no intervention needed.  Aspirin and Lipitor.  Anxiety Continue Zoloft  Right hip pain X-ray negative.  Patient able to straight leg raise so this is likely not a hip fracture I think it is due to the fall and large hematoma.  Syncope Check orthostatics.  Likely more secondary to vertigo.    Fall at home, initial encounter-resolved as of 09/18/2022 Fall at home in setting of dizziness/syncopal episode  No reported head trauma  + fall on R hip w/ normal plain films.  Pain control  PT/OT eval  Follow         Subjective: Still has severe dizziness, nausea, poor appetite  Physical Exam: Vitals:   09/20/22 2009 09/21/22 0305 09/21/22 0836 09/21/22 1537  BP: 122/71 121/68 120/65 120/67  Pulse: 91 88 83 88  Resp: '16 16 16 17  '$ Temp: 98.8 F (37.1 C) 97.9 F (36.6 C) 98.1 F (36.7 C) 98.3 F (36.8 C)  TempSrc: Oral Oral    SpO2: 96% 94% 95% 95%  Weight:      Height:       physical Exam HENT:     Head: Normocephalic.     Right Ear: Tympanic membrane normal.     Mouth/Throat:     Pharynx: No oropharyngeal exudate.  Eyes:     General: Lids are normal.     Conjunctiva/sclera: Conjunctivae normal.  Cardiovascular:     Rate and Rhythm: Normal rate and regular rhythm.     Heart sounds: Normal heart sounds, S1 normal and  S2 normal.  Pulmonary:     Breath sounds: No decreased breath sounds, wheezing, rhonchi or rales.  Abdominal:     Palpations: Abdomen is soft.     Tenderness: There is no abdominal tenderness.  Musculoskeletal:     Right lower leg: No swelling.     Left lower leg: No swelling.  Skin:    General: Skin is warm.     Findings: No rash.     Comments: Large bruise lower right buttock hip area laterally  Neurological:     Mental Status: He is alert and oriented to person, place, and time.      Data Reviewed:  Reviewed  independently  Family Communication: Patient is well conscious oriented  Disposition: Status is: Inpatient Remains inpatient appropriate because: Due to significant dizziness  Planned Discharge Destination: Skilled nursing facility  Time spent: 35 minutes  Author: Monica Becton, MD 09/21/2022 3:51 PM  For on call review [email or web address].

## 2022-09-21 NOTE — Care Management Important Message (Signed)
Important Message  Patient Details  Name: [name] MRN: [id number] Date of Birth: Jun 25, 1931   Medicare Important Message Given:  Yes     Dannette Barbara 09/21/2022, 12:32 PM

## 2022-09-22 DIAGNOSIS — R42 Dizziness and giddiness: Secondary | ICD-10-CM | POA: Diagnosis not present

## 2022-09-22 MED ORDER — SODIUM CHLORIDE 0.9 % IV SOLN: INTRAVENOUS | Status: DC

## 2022-09-22 NOTE — Plan of Care (Signed)
  Problem: Health Behavior/Discharge Planning: Goal: Ability to manage health-related needs will improve Outcome: Progressing

## 2022-09-22 NOTE — Progress Notes (Signed)
So regarding Progress Note   Patient: [name] [id number] DOB: 07-25-1931 DOA: 09/17/2022     4 DOS: the patient was seen and examined on 09/22/2022   Brief hospital course: 89 year old male with past medical history of vertigo, hyperlipidemia, type 2 diabetes mellitus.  He states he got up to go to the bathroom and sat down.  He looked to the right and down and ended up on the ground he fell and hurt his hip.  He has a large bruise on his hip and having quite a bit of pain there. 1/9.  He worked with physical therapy more with walking and was limited with his hip and they did not do the vestibular training.  1/10.  Patient tried to get up overnight and had an unwitnessed fall.  Patient still continues to have significant vertigo.  Patient did not receive even 1 dose of Valium or meclizine yesterday. 1/11 MRI ordered-no stroke 1/12 started on Decadron concern for vestibular neuritis  Assessment and Plan:  89 year old male with history of vertigo, hyperlipidemia, type 2 diabetes mellitus experienced an episode of vertigo, headache on considering this patient was brought to the emergency department.  Patient continues to have dizziness.  CT head without contrast did not show any acute intracranial abnormality.  CT angio of the head and neck showed severe intracranial atherosclerotic disease resulting in moderate to severe stenosis of the left supraclinoid ICA, mild to moderate stenosis of the right supraclinoid ICA on the right, moderate stenosis of the proximal left P2 segment, mild to moderate stenosis of the proximal P2 segment.  Severe vertigo -Patient is experiencing severe nausea, vomiting, dizziness, visual defects. Physical therapy -Considering patient's extensive atherosclerotic disease on the CT angiogram, -MRI of the brain to rule out CVA-negative -Continue with high-dose statin, aspirin -Added Decadron concerning about more dizziness on the right side, concern about vestibular  neuritis -good improvement with dizziness from yesterday -also started the patient on IV fluids  Right hip pain -X-rays are negative for any fracture. -Improving  Anxiety -Continue with Zoloft  Recurrent falls -Fall precautions - continue with physical therapy, occupational therapy Ruled out CVA with CT head, MRI of the brain Continue with physical therapy, Occupational Therapy   Carotid artery stenosis -patient will need to follow-up as an outpatient  Subjective: Patient seen and examined at bedside today.  Patient had an unwitnessed fall this morning.  Patient still has significant vertigo.    Physical Exam: Vitals:   09/21/22 1951 09/21/22 1953 09/22/22 0413 09/22/22 0759  BP: 106/64 112/69 121/71 120/69  Pulse: 91 92 79 69  Resp: '20 20 16 14  '$ Temp: 98.5 F (36.9 C)  (!) 97.5 F (36.4 C) 97.8 F (36.6 C)  TempSrc: Oral  Oral Oral  SpO2: 95% 97% 93% 95%  Weight:      Height:       Physical Exam HENT:     Head: Normocephalic.     Right Ear: Tympanic membrane normal.     Mouth/Throat:     Pharynx: No oropharyngeal exudate.  Eyes:     General: Lids are normal.     Conjunctiva/sclera: Conjunctivae normal.  Cardiovascular:     Rate and Rhythm: Normal rate and regular rhythm.     Heart sounds: Normal heart sounds, S1 normal and S2 normal.  Pulmonary:     Breath sounds: No decreased breath sounds, wheezing, rhonchi or rales.  Abdominal:     Palpations: Abdomen is soft.     Tenderness: There  is no abdominal tenderness.  Musculoskeletal:     Right lower leg: No swelling.     Left lower leg: No swelling.  Skin:    General: Skin is warm.     Findings: No rash.     Comments: Large bruise lower right buttock hip area laterally  Neurological:     Mental Status: He is alert and oriented to person, place, and time.     Comments: Able to straight leg raise bilaterally discomfort when doing his right leg.     Data Reviewed: Sodium 134, LDL 75, hemoglobin 12.5, white  blood cell count 14.0  Family Communication: Spoke with daughter at the bedside  Disposition: Status is: Inpatient Remains inpatient appropriate because: Physical therapy worked with the strength today and would to do some vestibular training.  Planned Discharge Destination: Home with Home Health    Time spent: 28 minutes  Author: Monica Becton, MD 09/22/2022 9:26 AM

## 2022-09-23 DIAGNOSIS — R42 Dizziness and giddiness: Secondary | ICD-10-CM | POA: Diagnosis not present

## 2022-09-23 MED ORDER — ACETAMINOPHEN 325 MG PO TABS
650.0000 mg | ORAL_TABLET | Freq: Four times a day (QID) | ORAL | Status: DC | PRN
  Administered 2022-09-23 – 2022-09-24 (×2): 650 mg via ORAL
  Filled 2022-09-23 (×2): qty 2

## 2022-09-23 NOTE — Progress Notes (Signed)
Mobility Specialist - Progress Note   09/23/22 0914  Mobility  Activity Ambulated with assistance in room;Stood at bedside;Dangled on edge of bed  Level of Assistance Minimal assist, patient does 75% or more  Assistive Device Front wheel walker  Distance Ambulated (ft) 70 ft  Activity Response Tolerated well  Mobility Referral Yes  $Mobility charge 1 Mobility   Pt semi-supine in bed on RA upon arrival. Pt completes bed mobility MinA. Pt STS and ambulates within room CGA. Pt able to take steps forward, backwards, and laterally without LOB. Pt returns to bed with needs in reach, bed alarm set, and RN in room.   Gretchen Short  Mobility Specialist  09/23/22 9:17 AM

## 2022-09-23 NOTE — Progress Notes (Signed)
So regarding Progress Note   Patient: [name] [id number]:[id number] DOB: July 18, 1931 DOA: 09/17/2022     5 DOS: the patient was seen and examined on 09/23/2022   Brief hospital course: 89 year old male with past medical history of vertigo, hyperlipidemia, type 2 diabetes mellitus.  He states he got up to go to the bathroom and sat down.  He looked to the right and down and ended up on the ground he fell and hurt his hip.  He has a large bruise on his hip and having quite a bit of pain there. 1/9.  He worked with physical therapy more with walking and was limited with his hip and they did not do the vestibular training.  1/10.  Patient tried to get up overnight and had an unwitnessed fall.  Patient still continues to have significant vertigo.  Patient did not receive even 1 dose of Valium or meclizine yesterday. 1/11 MRI ordered-no stroke 1/12 started on Decadron concern for vestibular neuritis 1/13 improvement of dizziness.  PT eval pending 1/14 improved dizziness, participated in physical therapy  Assessment and Plan:  89 year old male with history of vertigo, hyperlipidemia, type 2 diabetes mellitus experienced an episode of vertigo, headache on considering this patient was brought to the emergency department.  Patient continues to have dizziness.  CT head without contrast did not show any acute intracranial abnormality.  CT angio of the head and neck showed severe intracranial atherosclerotic disease resulting in moderate to severe stenosis of the left supraclinoid ICA, mild to moderate stenosis of the right supraclinoid ICA on the right, moderate stenosis of the proximal left P2 segment, mild to moderate stenosis of the proximal P2 segment.  Severe vertigo -Patient is experiencing severe nausea, vomiting, dizziness, visual defects. Physical therapy -Considering patient's extensive atherosclerotic disease on the CT angiogram, -MRI of the brain to rule out CVA-negative -Continue with high-dose  statin, aspirin -Added Decadron concerning about more dizziness on the right side, concern about vestibular neuritis -good improvement with dizziness from yesterday -also started the patient on IV fluids -Good improvement from yesterday.  Right hip pain -X-rays are negative for any fracture. -Improving  Anxiety -Continue with Zoloft  Recurrent falls -Fall precautions - continue with physical therapy, occupational therapy Ruled out CVA with CT head, MRI of the brain Continue with physical therapy, Occupational Therapy   Carotid artery stenosis -patient will need to follow-up as an outpatient  Subjective: Improved dizziness from yesterday.  Is able to participate in physical therapy walked 3-4 times in the hallway.  Patient needing contact-guard assistance. Physical Exam: Vitals:   09/22/22 1941 09/22/22 1942 09/23/22 0434 09/23/22 0746  BP: (!) 103/59 113/64 120/74 114/64  Pulse: 79 81 78 64  Resp: '20 20 20 16  '$ Temp: 98.2 F (36.8 C)  98.3 F (36.8 C) (!) 97.5 F (36.4 C)  TempSrc: Oral Oral Oral Oral  SpO2: 95% 96% 99% 98%  Weight:      Height:       Physical Exam HENT:     Head: Normocephalic.     Right Ear: Tympanic membrane normal.     Mouth/Throat:     Pharynx: No oropharyngeal exudate.  Eyes:     General: Lids are normal.     Conjunctiva/sclera: Conjunctivae normal.  Cardiovascular:     Rate and Rhythm: Normal rate and regular rhythm.     Heart sounds: Normal heart sounds, S1 normal and S2 normal.  Pulmonary:     Breath sounds: No decreased breath sounds, wheezing,  rhonchi or rales.  Abdominal:     Palpations: Abdomen is soft.     Tenderness: There is no abdominal tenderness.  Musculoskeletal:     Right lower leg: No swelling.     Left lower leg: No swelling.  Skin:    General: Skin is warm.     Findings: No rash.     Comments: Large bruise lower right buttock hip area laterally  Neurological:     Mental Status: He is alert and oriented to person,  place, and time.     Comments: Able to straight leg raise bilaterally discomfort when doing his right leg.     Data Reviewed: Sodium 134, LDL 75, hemoglobin 12.5, white blood cell count 14.0  Family Communication: Spoke with daughter at the bedside  Disposition: Status is: Inpatient Remains inpatient appropriate because: Physical therapy worked with the strength today and would to do some vestibular training.  Planned Discharge Destination: Home with Home Health    Time spent: 28 minutes  Author: Monica Becton, MD 09/23/2022 8:22 AM

## 2022-09-23 NOTE — Progress Notes (Signed)
Mobility Specialist - Progress Note   09/23/22 1535  Mobility  Activity Ambulated with assistance in room;Stood at bedside;Dangled on edge of bed  Level of Assistance Contact guard assist, steadying assist  Assistive Device Front wheel walker  Distance Ambulated (ft) 100 ft  Activity Response Tolerated well  Mobility Referral Yes  $Mobility charge 1 Mobility   Pt semi-supine in bed on RA upon arrival. Pt completes bed mobility with HHA. Pt STS and ambulates within room CGA. Pt returns to bed with needs in reach, and bed alarm set.  Gretchen Short  Mobility Specialist  09/23/22 3:39 PM

## 2022-09-24 LAB — CREATININE, SERUM
Creatinine, Ser: 0.66 mg/dL (ref 0.61–1.24)
GFR, Estimated: >60 mL/min (ref >60)

## 2022-09-24 LAB — VITAMIN D 25 HYDROXY (VIT D DEFICIENCY, FRACTURES)

## 2022-09-24 MED ORDER — VITAMIN D 25 MCG (1000 UNIT) PO TABS: 1000.0000 [IU] | ORAL_TABLET | Freq: Every day | ORAL | Status: DC

## 2022-09-24 MED ORDER — DEXAMETHASONE 0.5 MG PO TABS: 2.0000 mg | ORAL_TABLET | Freq: Two times a day (BID) | ORAL | 0 refills | Status: AC

## 2022-09-24 NOTE — Discharge Summary (Signed)
Physician Discharge Summary   Patient: [name] MRN: [id number] DOB: 05-29-1931  Admit date:     09/17/2022  Discharge date: 09/24/22  Discharge Physician: [id number],[email or web address]   PCP: Idelle Crouch, MD   Recommendations at discharge:   Follow-up with primary care physician in 1 week  Discharge Diagnoses: Principal Problem:   Vertigo Active Problems:   Stenosis of intracranial vessel   Syncope   Right hip pain   Anxiety   Protein-calorie malnutrition, severe  Resolved Problems:   Fall at home, initial encounter  Hospital Course: No notes on file  Assessment and Plan: 89 year old male with history of vertigo, hyperlipidemia, type 2 diabetes mellitus experienced an episode of vertigo, headache on considering this patient was brought to the emergency department.  Patient continues to have dizziness.  CT head without contrast did not show any acute intracranial abnormality.  CT angio of the head and neck showed severe intracranial atherosclerotic disease resulting in moderate to severe stenosis of the left supraclinoid ICA, mild to moderate stenosis of the right supraclinoid ICA on the right, moderate stenosis of the proximal left P2 segment, mild to moderate stenosis of the proximal P2 segment.  MRI of the brain was negative for CVA.  Started the patient on Decadron concerning about vestibular neuritis patient had significant improvement.  Evaluated by physical therapy, was able to ambulate about 240 feet with a walker.  Patient will benefit from home with home health with physical therapy.  Updated patient's daughter over the phone.  Recommended patient to follow-up with primary care physician in 1 week.   Severe vertigo -Patient is experiencing severe nausea, vomiting, dizziness, visual defects. Physical therapy -Considering patient's extensive atherosclerotic disease on the CT angiogram, -MRI of the brain to rule out CVA-negative -Continue with high-dose statin, aspirin -Added  Decadron concerning about more dizziness on the right side, concern about vestibular neuritis -good improvement with dizziness from yesterday -also started the patient on IV fluids -Good improvement from yesterday.   Right hip pain -X-rays are negative for any fracture. -Improving   Anxiety -Continue with Zoloft   Recurrent falls -Fall precautions - continue with physical therapy, occupational therapy Ruled out CVA with CT head, MRI of the brain Continue with physical therapy, Occupational Therapy    Carotid artery stenosis -patient will need to follow-up as an outpatient      Pain control - [facility] Controlled Substance Reporting System database was reviewed. and patient was instructed, not to drive, operate heavy machinery, perform activities at heights, swimming or participation in water activities or provide baby-sitting services while on Pain, Sleep and Anxiety Medications; until their outpatient Physician has advised to do so again. Also recommended to not to take more than prescribed Pain, Sleep and Anxiety Medications.  Consultants:  Procedures performed:   Disposition: Home health Diet recommendation:  Discharge Diet Orders (From admission, onward)     Start     Ordered   09/24/22 0000  Diet - low sodium heart healthy        09/24/22 1317           Cardiac diet DISCHARGE MEDICATION: Allergies as of 09/24/2022       Reactions   Flexeril [cyclobenzaprine]    Novocain [procaine]         Medication List     STOP taking these medications    meclizine 25 MG tablet Commonly known as: ANTIVERT       TAKE these medications    calcium carbonate 1250 (  500 Ca) MG tablet Commonly known as: OS-CAL - dosed in mg of elemental calcium Take 1 tablet by mouth daily.   dexamethasone 0.5 MG tablet Commonly known as: Decadron Take 4 tablets (2 mg total) by mouth 2 (two) times daily for 5 days.   PRESERVISION AREDS 2 PO Take 1 capsule by mouth daily.    sertraline 50 MG tablet Commonly known as: ZOLOFT Take 1 tablet by mouth daily.               Durable Medical Equipment  (From admission, onward)           Start     Ordered   09/20/22 1050  For home use only DME 3 n 1  Once        09/20/22 1049            Follow-up Information     Sparks, Leonie Douglas, MD Follow up in 5 day(s).   Specialty: Internal Medicine Contact information: [place] [address] [place], [facility] Follow up.   Specialty: Home Health Services Why: They will follow up with you for your home health therapy needs. Contact information: [address] [place] [address] [phone]                Discharge Exam: Danley Danker Weights   09/17/22 1503 09/18/22 0343 09/24/22 0900  Weight: 71.5 kg 68 kg 71.8 kg     Condition at discharge: stable  The results of significant diagnostics from this hospitalization (including imaging, microbiology, ancillary and laboratory) are listed below for reference.   Imaging Studies: MR BRAIN WO CONTRAST  Result Date: 09/20/2022 CLINICAL DATA:  Transient ischemic attack (TIA) EXAM: MRI HEAD WITHOUT CONTRAST TECHNIQUE: Multiplanar, multiecho pulse sequences of the brain and surrounding structures were obtained without intravenous contrast. COMPARISON:  CTA head/neck 09/17/2022. FINDINGS: Brain: No acute infarction, acute hemorrhage, hydrocephalus, extra-axial collection or mass lesion. Punctate chronic microhemorrhage in the pons. Mild to moderate for age scattered T2/FLAIR hyperintensities in the white matter, nonspecific but compatible with chronic microvascular ischemic disease. Cerebral atrophy. Vascular: Major arterial flow voids are maintained at the skull base. Skull and upper cervical spine: Normal marrow signal. Sinuses/Orbits: Mild paranasal sinus mucosal thickening. Other: No mastoid effusions. IMPRESSION: No evidence of acute intracranial  abnormality. Electronically Signed   By: Margaretha Sheffield M.D.   On: 09/20/2022 12:34   ECHOCARDIOGRAM COMPLETE  Result Date: 09/19/2022    ECHOCARDIOGRAM REPORT   Patient Name:   PURL CLAYTOR Date of Exam: 09/18/2022 Medical Rec #:  [id number]   Height:       70.0 in Accession #:    [id number]  Weight:       149.9 lb Date of Birth:  16-May-1931   BSA:          1.847 m Patient Age:    59 years    BP:           144/72 mmHg Patient Gender: M           HR:           81 bpm. Exam Location:  [facility] Procedure: 2D Echo, Cardiac Doppler and Color Doppler Indications:     R01.2 Other cardiac heart sounds  History:         Patient has no prior history of Echocardiogram examinations.  Sonographer:     Wilford Sports Rodgers-Jones RDCS Referring Phys:  [place] Diagnosing Phys: Yolonda Kida MD IMPRESSIONS  1. Left ventricular ejection fraction, by estimation, is 60 to 65%. The left ventricle has normal function. The left ventricle has no regional wall motion abnormalities. Left ventricular diastolic parameters are consistent with Grade I diastolic dysfunction (impaired relaxation).  2. Right ventricular systolic function is normal. The right ventricular size is normal.  3. The mitral valve is normal in structure. Trivial mitral valve regurgitation.  4. The aortic valve is normal in structure. Aortic valve regurgitation is trivial. Aortic valve sclerosis is present, with no evidence of aortic valve stenosis.  5. Aortic dilatation noted. There is mild dilatation of the aortic root, measuring 40 mm. FINDINGS  Left Ventricle: Left ventricular ejection fraction, by estimation, is 60 to 65%. The left ventricle has normal function. The left ventricle has no regional wall motion abnormalities. The left ventricular internal cavity size was normal in size. There is  borderline concentric left ventricular hypertrophy. Left ventricular diastolic parameters are consistent with Grade I diastolic dysfunction (impaired relaxation).  Right Ventricle: The right ventricular size is normal. No increase in right ventricular wall thickness. Right ventricular systolic function is normal. Left Atrium: Left atrial size was normal in size. Right Atrium: Right atrial size was normal in size. Pericardium: There is no evidence of pericardial effusion. Mitral Valve: The mitral valve is normal in structure. Trivial mitral valve regurgitation. Tricuspid Valve: The tricuspid valve is normal in structure. Tricuspid valve regurgitation is mild. Aortic Valve: The aortic valve is normal in structure. Aortic valve regurgitation is trivial. Aortic valve sclerosis is present, with no evidence of aortic valve stenosis. Aortic valve mean gradient measures 18.7 mmHg. Aortic valve peak gradient measures  31.4 mmHg. Aortic valve area, by VTI measures 1.57 cm. Pulmonic Valve: The pulmonic valve was normal in structure. Pulmonic valve regurgitation is not visualized. Aorta: Aortic dilatation noted. There is mild dilatation of the aortic root, measuring 40 mm. IAS/Shunts: No atrial level shunt detected by color flow Doppler.  LEFT VENTRICLE PLAX 2D LVIDd:         4.40 cm   Diastology LVIDs:         3.00 cm   LV e' medial:    7.07 cm/s LV PW:         1.10 cm   LV E/e' medial:  10.5 LV IVS:        1.10 cm   LV e' lateral:   9.79 cm/s LVOT diam:     2.10 cm   LV E/e' lateral: 7.5 LV SV:         72 LV SV Index:   39 LVOT Area:     3.46 cm  RIGHT VENTRICLE             IVC RV Basal diam:  3.20 cm     IVC diam: 1.10 cm RV S prime:     16.40 cm/s TAPSE (M-mode): 1.8 cm LEFT ATRIUM             Index        RIGHT ATRIUM          Index LA diam:        3.50 cm 1.90 cm/m   RA Area:     7.68 cm LA Vol (A2C):   27.1 ml 14.68 ml/m  RA Volume:   14.30 ml 7.74 ml/m LA Vol (A4C):   27.9 ml 15.11 ml/m LA Biplane Vol: 27.9 ml 15.11 ml/m  AORTIC VALVE AV Area (  Vmax):    1.46 cm AV Area (Vmean):   1.31 cm AV Area (VTI):     1.57 cm AV Vmax:           280.00 cm/s AV Vmean:           204.333 cm/s AV VTI:            0.460 m AV Peak Grad:      31.4 mmHg AV Mean Grad:      18.7 mmHg LVOT Vmax:         118.00 cm/s LVOT Vmean:        77.000 cm/s LVOT VTI:          0.209 m LVOT/AV VTI ratio: 0.45  AORTA Ao Root diam: 4.10 cm Ao Asc diam:  4.00 cm MITRAL VALVE MV Area (PHT): 3.21 cm     SHUNTS MV Decel Time: 236 msec     Systemic VTI:  0.21 m MV E velocity: 73.90 cm/s   Systemic Diam: 2.10 cm MV A velocity: 139.00 cm/s MV E/A ratio:  0.53 Yolonda Kida MD Electronically signed by Yolonda Kida MD Signature Date/Time: 09/19/2022/7:57:41 AM    Final    CT Angio Head Neck W WO CM  Result Date: 09/17/2022 CLINICAL DATA:  Dizziness and fall. EXAM: CT ANGIOGRAPHY HEAD AND NECK TECHNIQUE: Multidetector CT imaging of the head and neck was performed using the standard protocol during bolus administration of intravenous contrast. Multiplanar CT image reconstructions and MIPs were obtained to evaluate the vascular anatomy. Carotid stenosis measurements (when applicable) are obtained utilizing NASCET criteria, using the distal internal carotid diameter as the denominator. RADIATION DOSE REDUCTION: This exam was performed according to the departmental dose-optimization program which includes automated exposure control, adjustment of the mA and/or kV according to patient size and/or use of iterative reconstruction technique. CONTRAST:  154m OMNIPAQUE IOHEXOL 350 MG/ML SOLN COMPARISON:  MR head 08/24/2022 FINDINGS: CT HEAD FINDINGS Brain: There is no acute intracranial hemorrhage, extra-axial fluid collection, or acute infarct. Parenchymal volume is normal for age. The ventricles are stable in size. Gray-white differentiation is preserved There is no mass lesion. There is no mass effect or midline shift. The pituitary and suprasellar regions are normal. Vascular: See below. Skull: Normal. Negative for fracture or focal lesion. Sinuses/Orbits: The paranasal sinuses are clear. Bilateral lens implants are in  place. The globes and orbits are otherwise unremarkable. Other: None. Review of the MIP images confirms the above findings CTA NECK FINDINGS Aortic arch: There is mild calcified plaque in the imaged aortic arch. The origins of the major branch vessels are patent. The subclavian arteries are patent to the level imaged. Right carotid system: The right common, internal, and external carotid arteries are patent, with mild plaque of the bifurcation but no hemodynamically significant stenosis or occlusion. There is no dissection or aneurysm. Left carotid system: The left common, internal, and external carotid arteries are patent, without hemodynamically significant stenosis or occlusion. There is no dissection or aneurysm. Vertebral arteries: The vertebral arteries are patent, without hemodynamically significant stenosis or occlusion. There is no dissection or aneurysm. Skeleton: There is no acute osseous abnormality or suspicious osseous lesion. There is advanced disc space narrowing and degenerative endplate change CO8-C1and T1-T2. There is no visible canal hematoma. Other neck: The soft tissues of the neck are unremarkable. Upper chest: The imaged lung apices are clear. Review of the MIP images confirms the above findings CTA HEAD FINDINGS Anterior circulation: There is calcified plaque  in the intracranial ICAs resulting in moderate to severe stenosis of the left supraclinoid segment and mild-to-moderate stenosis of the right supraclinoid segment. The bilateral MCAs are patent, without proximal high-grade stenosis or occlusion. There is mild atherosclerotic irregularity and narrowing of the distal branches. The bilateral ACAs are patent. There is focal moderate stenosis of the proximal left A2 segment (11-78). The anterior communicating artery is normal. There is no aneurysm or AVM. Posterior circulation: The bilateral V4 segments are patent. The basilar artery is patent. The major cerebellar arteries are patent. There  is focal mild-to-moderate stenosis of the proximal left P2 segment (11-121). There is additional mild multifocal irregularity and narrowing of the distal branches. There is no aneurysm or AVM. Venous sinuses: Patent. Anatomic variants: None. Review of the MIP images confirms the above findings IMPRESSION: 1. No acute intracranial pathology. 2. Patent vasculature of the neck with mild calcified plaque at the right carotid bifurcation without hemodynamically significant stenosis or occlusion. 3. Intracranial atherosclerotic disease resulting in moderate to severe stenosis of the left supraclinoid ICA, mild-to-moderate stenosis of the right supraclinoid ICA, moderate stenosis of the proximal left A2 segment, and mild-to-moderate stenosis of the proximal left P2 segment. Electronically Signed   By: Valetta Mole M.D.   On: 09/17/2022 17:32   DG Hip Unilat With Pelvis 2-3 Views Right  Result Date: 09/17/2022 CLINICAL DATA:  Right hip pain after fall. EXAM: DG HIP (WITH OR WITHOUT PELVIS) 2-3V RIGHT COMPARISON:  None Available. FINDINGS: There is no evidence of hip fracture or dislocation. There is no evidence of arthropathy or other focal bone abnormality. Osteopenia. IMPRESSION: Negative. Electronically Signed   By: Titus Dubin M.D.   On: 09/17/2022 16:19    Microbiology: No results found for this or any previous visit.  Labs: CBC: Recent Labs  Lab 09/17/22 1500 09/18/22 0146 09/19/22 0411  WBC 9.9 14.0* 13.2*  NEUTROABS  --  11.9*  --   HGB 14.4 12.5* 11.2*  HCT 42.7 37.3* 33.2*  MCV 91.0 90.1 88.5  PLT 246 232 992   Basic Metabolic Panel: Recent Labs  Lab 09/17/22 1500 09/18/22 0146 09/19/22 0411 09/20/22 0410 09/24/22 0444  NA 138 134* 133*  --   --   K 4.8 3.8 4.0  --   --   CL 103 101 99  --   --   CO2 '27 24 26  '$ --   --   GLUCOSE 119* 138* 140*  --   --   BUN '9 10 12  '$ --   --   CREATININE 0.75 0.80 0.67  --  0.66  CALCIUM 9.3 8.8* 8.8*  --   --   MG  --   --   --  2.0  --    PHOS  --   --   --  3.4  --    Liver Function Tests: Recent Labs  Lab 09/17/22 1500 09/18/22 0146  AST 31 25  ALT 20 20  ALKPHOS 64 60  BILITOT 1.4* 1.6*  PROT 6.9 6.3*  ALBUMIN 3.9 3.7   CBG: Recent Labs  Lab 09/19/22 0737  GLUCAP 121*    Discharge time spent: greater than 30 minutes.  SignedMonica Becton, MD [facility] 09/24/2022

## 2022-09-24 NOTE — Progress Notes (Addendum)
Physical Therapy Treatment Patient Details Name: [name] MRN: [id number] DOB: [birth year] Today's Date: 09/24/2022   History of Present Illness [name] is an 64yoM who [facility]22 after 2 episodes of dizziness weakness, which included a fall and episodic vertigo. PMH: anemia, HLD, DM. Has been evaluated by PCP, ENT in past month including a negative MRI brain. Mechanical fall at home in April 2023 with subsequent suspected full thickness tear of Left rotator cuff.    PT Comments    Pt in bed on entry, agreeable to session. Pt was previously treated for Rt sided BPPV at his last PT session. Today his posterior and horizontal canal screenings are negative bilat. Pt has no dizziness or vertigo in session, no nystagmus seen. Orthostatic vitals are screened as well, not typical in response to positional changes, however they do not meet the criteria for official orthostasis. Pt has no symptoms with BP testing. Pt remains somewhat weak, requires minA to get to EOB and max effort to rise to standing. Pt able to advance AMB to 280f with RW without any presyncope or other symptoms. Pt continues to progress well in general. Pt agreeable to trial OOB to chair at end of session, aPryor [name] a modified sitting surface with linen to offload Rt ischael tuberosity.   Orthostatic VS for the past 24 hrs:  BP- Lying Pulse- Lying BP- Sitting Pulse- Sitting BP- Standing at 0 minutes Pulse- Standing at 0 minutes  09/24/22 1039 122/60 63 117/63 72 130/66 76      Recommendations for follow up therapy are one component of a multi-disciplinary discharge planning process, led by the attending physician.  Recommendations may be updated based on patient status, additional functional criteria and insurance authorization.  Follow Up Recommendations  Home health PT     Assistance Recommended at Discharge Intermittent Supervision/Assistance  Patient can return home with the following A little help with  walking and/or transfers;A little help with bathing/dressing/bathroom;Assistance with cooking/housework;Help with stairs or ramp for entrance;Assist for transportation;Direct supervision/assist for financial management;Direct supervision/assist for medications management   Equipment Recommendations  Rolling walker (2 wheels)    Recommendations for Other Services       Precautions / Restrictions Precautions Precautions: Fall Restrictions Weight Bearing Restrictions: No     Mobility  Bed Mobility Overal bed mobility: Needs Assistance Bed Mobility: Supine to Sit, Sit to Supine     Supine to sit: Min assist     General bed mobility comments: pt feeling weaker today, requires minA to get to EOB    Transfers Overall transfer level: Needs assistance Equipment used: Rolling walker (2 wheels) Transfers: Sit to/from Stand             General transfer comment: max effort hugging RW, appears more controlled and safe with bed elevated    Ambulation/Gait Ambulation/Gait assistance: Min guard, Supervision Gait Distance (Feet): 240 Feet Assistive device: Rolling walker (2 wheels) Gait Pattern/deviations: WFL(Within Functional Limits)       General Gait Details: AMB to RN counter and back to bedside 3x, no dizziness, no vertigo   Stairs             Wheelchair Mobility    Modified Rankin (Stroke Patients Only)       Balance  Cognition Arousal/Alertness: Awake/alert Behavior During Therapy: WFL for tasks assessed/performed Overall Cognitive Status: Within Functional Limits for tasks assessed                                          Exercises Other Exercises Other Exercises: Repeat Dix-Hallpike negative bilat today; Supine roll test for horizontal canal BPPV negative bilat today    General Comments        Pertinent Vitals/Pain Pain Assessment Pain Assessment: Faces Faces  Pain Scale: Hurts even more Pain Location: Rt ischeal tuberosity when sitting Pain Intervention(s): Monitored during session, Premedicated before session, Repositioned, Limited activity within patient's tolerance    Home Living                          Prior Function            PT Goals (current goals can now be found in the care plan section) Acute Rehab PT Goals Patient Stated Goal: tolerate sitting, not have more LOC or dizziness PT Goal Formulation: With patient Time For Goal Achievement: 10/02/22 Potential to Achieve Goals: Good Progress towards PT goals: Progressing toward goals    Frequency    Min 2X/week      PT Plan Current plan remains appropriate    Co-evaluation              AM-PAC PT "6 Clicks" Mobility   Outcome Measure  Help needed turning from your back to your side while in a flat bed without using bedrails?: A Little Help needed moving from lying on your back to sitting on the side of a flat bed without using bedrails?: A Little Help needed moving to and from a bed to a chair (including a wheelchair)?: A Little Help needed standing up from a chair using your arms (e.g., wheelchair or bedside chair)?: A Little Help needed to walk in hospital room?: A Little Help needed climbing 3-5 steps with a railing? : A Lot 6 Click Score: 17    End of Session Equipment Utilized During Treatment: Gait belt Activity Tolerance: Patient tolerated treatment well Patient left: in chair;with call [place]/phone within reach Nurse Communication: Mobility status PT Visit Diagnosis: Difficulty in walking, not elsewhere classified (R26.2);Other symptoms and signs involving the nervous system (R29.898)     Time: 3614-4315 PT Time Calculation (min) (ACUTE ONLY): 44 min  Charges:  $Therapeutic Exercise: 8-22 mins $Therapeutic Activity: 23-37 mins                    10:51 AM, 09/24/22 Etta Grandchild, PT, DPT Physical Therapist - [facility]  [phone] ([place])    [name] 09/24/2022, 10:48 AM

## 2022-09-24 NOTE — Care Management Important Message (Signed)
Important Message  Patient Details  Name: TINY RIETZ MRN: [id number] Date of Birth: 1930-11-19   Medicare Important Message Given:  Yes     Dannette Barbara 09/24/2022, 11:00 AM

## 2022-09-24 NOTE — TOC Transition Note (Signed)
Transition of Care [facility]) - CM/SW Discharge Note   Patient Details  Name: ABIMAEL ZEITER MRN: [id number] Date of Birth: 1931-02-15  Transition of Care [facility]) CM/SW Contact:  Laurena Slimmer, RN Phone Number: 09/24/2022, 2:56 PM   Clinical Narrative:    Merleen Nicely at [facility] notified of patient's discharge.   TOC signing off.    Final next level of care: Home w Home Health Services Barriers to Discharge: Barriers Resolved   Patient Goals and CMS Choice CMS Medicare.gov Compare Post Acute Care list provided to:: Patient    Discharge Placement                         Discharge Plan and Services Additional resources added to the After Visit Summary for       Post Acute Care Choice: [place]: PT [place]: [facility] [place]   Time [place]: [phone] Representative spoke with at [place]: Merleen Nicely  Social Determinants of Health ([place]) Interventions SDOH Screenings   Food Insecurity: No Food Insecurity (09/18/2022)  Housing: Low Risk  (09/18/2022)  Transportation Needs: No Transportation Needs (09/18/2022)  Utilities: Not At Risk (09/18/2022)  Tobacco Use: Medium Risk (09/18/2022)     Readmission Risk Interventions     No data to display

## 2022-10-02 DIAGNOSIS — I6523 Occlusion and stenosis of bilateral carotid arteries: Secondary | ICD-10-CM | POA: Diagnosis not present

## 2022-10-02 DIAGNOSIS — R42 Dizziness and giddiness: Secondary | ICD-10-CM | POA: Diagnosis not present

## 2022-10-02 DIAGNOSIS — E43 Unspecified severe protein-calorie malnutrition: Secondary | ICD-10-CM | POA: Diagnosis not present

## 2022-10-02 DIAGNOSIS — E785 Hyperlipidemia, unspecified: Secondary | ICD-10-CM | POA: Diagnosis not present

## 2022-10-02 DIAGNOSIS — H9193 Unspecified hearing loss, bilateral: Secondary | ICD-10-CM | POA: Diagnosis not present

## 2022-10-02 DIAGNOSIS — E538 Deficiency of other specified B group vitamins: Secondary | ICD-10-CM | POA: Diagnosis not present

## 2022-10-02 DIAGNOSIS — E7800 Pure hypercholesterolemia, unspecified: Secondary | ICD-10-CM | POA: Diagnosis not present

## 2022-10-02 DIAGNOSIS — Z09 Encounter for follow-up examination after completed treatment for conditions other than malignant neoplasm: Secondary | ICD-10-CM | POA: Diagnosis not present

## 2022-10-02 DIAGNOSIS — E119 Type 2 diabetes mellitus without complications: Secondary | ICD-10-CM | POA: Diagnosis not present

## 2022-10-02 DIAGNOSIS — R55 Syncope and collapse: Secondary | ICD-10-CM | POA: Diagnosis not present

## 2022-10-02 DIAGNOSIS — F419 Anxiety disorder, unspecified: Secondary | ICD-10-CM | POA: Diagnosis not present

## 2022-10-02 DIAGNOSIS — I2510 Atherosclerotic heart disease of native coronary artery without angina pectoris: Secondary | ICD-10-CM | POA: Diagnosis not present

## 2022-10-03 DIAGNOSIS — I6523 Occlusion and stenosis of bilateral carotid arteries: Secondary | ICD-10-CM | POA: Diagnosis not present

## 2022-10-03 DIAGNOSIS — R55 Syncope and collapse: Secondary | ICD-10-CM | POA: Diagnosis not present

## 2022-10-03 DIAGNOSIS — E43 Unspecified severe protein-calorie malnutrition: Secondary | ICD-10-CM | POA: Diagnosis not present

## 2022-10-03 DIAGNOSIS — F419 Anxiety disorder, unspecified: Secondary | ICD-10-CM | POA: Diagnosis not present

## 2022-10-03 DIAGNOSIS — E119 Type 2 diabetes mellitus without complications: Secondary | ICD-10-CM | POA: Diagnosis not present

## 2022-10-03 DIAGNOSIS — I2510 Atherosclerotic heart disease of native coronary artery without angina pectoris: Secondary | ICD-10-CM | POA: Diagnosis not present

## 2022-10-03 DIAGNOSIS — R42 Dizziness and giddiness: Secondary | ICD-10-CM | POA: Diagnosis not present

## 2022-10-03 DIAGNOSIS — E785 Hyperlipidemia, unspecified: Secondary | ICD-10-CM | POA: Diagnosis not present

## 2022-10-03 DIAGNOSIS — H9193 Unspecified hearing loss, bilateral: Secondary | ICD-10-CM | POA: Diagnosis not present

## 2022-10-09 DIAGNOSIS — E119 Type 2 diabetes mellitus without complications: Secondary | ICD-10-CM | POA: Diagnosis not present

## 2022-10-09 DIAGNOSIS — I6523 Occlusion and stenosis of bilateral carotid arteries: Secondary | ICD-10-CM | POA: Diagnosis not present

## 2022-10-09 DIAGNOSIS — R55 Syncope and collapse: Secondary | ICD-10-CM | POA: Diagnosis not present

## 2022-10-09 DIAGNOSIS — F419 Anxiety disorder, unspecified: Secondary | ICD-10-CM | POA: Diagnosis not present

## 2022-10-09 DIAGNOSIS — I2510 Atherosclerotic heart disease of native coronary artery without angina pectoris: Secondary | ICD-10-CM | POA: Diagnosis not present

## 2022-10-09 DIAGNOSIS — E43 Unspecified severe protein-calorie malnutrition: Secondary | ICD-10-CM | POA: Diagnosis not present

## 2022-10-09 DIAGNOSIS — E785 Hyperlipidemia, unspecified: Secondary | ICD-10-CM | POA: Diagnosis not present

## 2022-10-09 DIAGNOSIS — H9193 Unspecified hearing loss, bilateral: Secondary | ICD-10-CM | POA: Diagnosis not present

## 2022-10-09 DIAGNOSIS — R42 Dizziness and giddiness: Secondary | ICD-10-CM | POA: Diagnosis not present

## 2022-10-10 DIAGNOSIS — I2510 Atherosclerotic heart disease of native coronary artery without angina pectoris: Secondary | ICD-10-CM | POA: Diagnosis not present

## 2022-10-10 DIAGNOSIS — E43 Unspecified severe protein-calorie malnutrition: Secondary | ICD-10-CM | POA: Diagnosis not present

## 2022-10-10 DIAGNOSIS — H9193 Unspecified hearing loss, bilateral: Secondary | ICD-10-CM | POA: Diagnosis not present

## 2022-10-10 DIAGNOSIS — E119 Type 2 diabetes mellitus without complications: Secondary | ICD-10-CM | POA: Diagnosis not present

## 2022-10-10 DIAGNOSIS — E785 Hyperlipidemia, unspecified: Secondary | ICD-10-CM | POA: Diagnosis not present

## 2022-10-10 DIAGNOSIS — R55 Syncope and collapse: Secondary | ICD-10-CM | POA: Diagnosis not present

## 2022-10-10 DIAGNOSIS — F419 Anxiety disorder, unspecified: Secondary | ICD-10-CM | POA: Diagnosis not present

## 2022-10-10 DIAGNOSIS — R42 Dizziness and giddiness: Secondary | ICD-10-CM | POA: Diagnosis not present

## 2022-10-10 DIAGNOSIS — I6523 Occlusion and stenosis of bilateral carotid arteries: Secondary | ICD-10-CM | POA: Diagnosis not present

## 2022-10-15 DIAGNOSIS — I2510 Atherosclerotic heart disease of native coronary artery without angina pectoris: Secondary | ICD-10-CM | POA: Diagnosis not present

## 2022-10-15 DIAGNOSIS — H9193 Unspecified hearing loss, bilateral: Secondary | ICD-10-CM | POA: Diagnosis not present

## 2022-10-15 DIAGNOSIS — F419 Anxiety disorder, unspecified: Secondary | ICD-10-CM | POA: Diagnosis not present

## 2022-10-15 DIAGNOSIS — I6523 Occlusion and stenosis of bilateral carotid arteries: Secondary | ICD-10-CM | POA: Diagnosis not present

## 2022-10-15 DIAGNOSIS — E43 Unspecified severe protein-calorie malnutrition: Secondary | ICD-10-CM | POA: Diagnosis not present

## 2022-10-15 DIAGNOSIS — R55 Syncope and collapse: Secondary | ICD-10-CM | POA: Diagnosis not present

## 2022-10-15 DIAGNOSIS — E785 Hyperlipidemia, unspecified: Secondary | ICD-10-CM | POA: Diagnosis not present

## 2022-10-15 DIAGNOSIS — R42 Dizziness and giddiness: Secondary | ICD-10-CM | POA: Diagnosis not present

## 2022-10-15 DIAGNOSIS — E119 Type 2 diabetes mellitus without complications: Secondary | ICD-10-CM | POA: Diagnosis not present

## 2022-10-19 DIAGNOSIS — F419 Anxiety disorder, unspecified: Secondary | ICD-10-CM | POA: Diagnosis not present

## 2022-10-19 DIAGNOSIS — H9193 Unspecified hearing loss, bilateral: Secondary | ICD-10-CM | POA: Diagnosis not present

## 2022-10-19 DIAGNOSIS — E43 Unspecified severe protein-calorie malnutrition: Secondary | ICD-10-CM | POA: Diagnosis not present

## 2022-10-19 DIAGNOSIS — R42 Dizziness and giddiness: Secondary | ICD-10-CM | POA: Diagnosis not present

## 2022-10-19 DIAGNOSIS — R55 Syncope and collapse: Secondary | ICD-10-CM | POA: Diagnosis not present

## 2022-10-19 DIAGNOSIS — E119 Type 2 diabetes mellitus without complications: Secondary | ICD-10-CM | POA: Diagnosis not present

## 2022-10-19 DIAGNOSIS — E785 Hyperlipidemia, unspecified: Secondary | ICD-10-CM | POA: Diagnosis not present

## 2022-10-19 DIAGNOSIS — I2510 Atherosclerotic heart disease of native coronary artery without angina pectoris: Secondary | ICD-10-CM | POA: Diagnosis not present

## 2022-10-19 DIAGNOSIS — I6523 Occlusion and stenosis of bilateral carotid arteries: Secondary | ICD-10-CM | POA: Diagnosis not present

## 2022-10-23 ENCOUNTER — Ambulatory Visit (INDEPENDENT_AMBULATORY_CARE_PROVIDER_SITE_OTHER): Payer: Medicare HMO | Admitting: Vascular Surgery

## 2022-10-23 ENCOUNTER — Encounter (INDEPENDENT_AMBULATORY_CARE_PROVIDER_SITE_OTHER): Payer: Self-pay | Admitting: Vascular Surgery

## 2022-10-23 VITALS — BP 128/73 | HR 73 | Resp 16 | Wt 157.8 lb

## 2022-10-23 DIAGNOSIS — R42 Dizziness and giddiness: Secondary | ICD-10-CM | POA: Diagnosis not present

## 2022-10-23 DIAGNOSIS — I669 Occlusion and stenosis of unspecified cerebral artery: Secondary | ICD-10-CM

## 2022-10-23 DIAGNOSIS — I6521 Occlusion and stenosis of right carotid artery: Secondary | ICD-10-CM | POA: Diagnosis not present

## 2022-10-23 DIAGNOSIS — I6529 Occlusion and stenosis of unspecified carotid artery: Secondary | ICD-10-CM | POA: Insufficient documentation

## 2022-10-23 NOTE — Assessment & Plan Note (Signed)
Unlikely from his carotid disease and intracranial disease would not be treated interventionally or surgically.  Consider ENT referral.

## 2022-10-23 NOTE — Progress Notes (Unsigned)
Patient ID: [name], male   DOB: 05-12-1931, 89 y.o.   MRN: [id number]  Chief Complaint  Patient presents with   New Patient (Initial Visit)    Ref [place] consult carotid artery stenosis    HPI [name] is a 89 y.o. male.  I am asked to see the patient by Mortimer Fries for evaluation of carotid artery stenosis.  Few weeks ago, the patient went to the emergency department after 2 falls with what essentially were syncopal episodes.  It sounds as if certain positions such as turning his head to the right or laying on his left side can sometimes initiate these dizzy episodes.  In 1 of these syncopal episodes, he was leaned over on the toilet when he fell over.  He did not have focal arm or leg weakness or numbness.  No speech or swallowing difficulty.  He had an extensive workup with MRIs not showing stroke and CT angiogram which I have independently reviewed.  This demonstrated essentially no cervical carotid stenosis his vertebral arteries also appear to have reasonably good flow with the right vertebral artery being clearly larger than the left.  Intracranially, there was significant calcific disease in the carotid arteries bilaterally.  I have showed these images to the patient and his family today.   Past Medical History:  Diagnosis Date   Macular degeneration    right eye    Past Surgical History:  Procedure Laterality Date   carpaltunnel Right 01/29/2018     Family History  Problem Relation Age of Onset   Cancer Father    Emphysema Maternal Uncle    Diabetes Maternal Grandmother    Diabetes Paternal Grandmother    Dementia Paternal Grandmother       Social History   Tobacco Use   Smoking status: Former    Types: Cigarettes    Quit date: 02/19/1973    Years since quitting: 49.7  Substance Use Topics   Alcohol use: No   Drug use: Never     Allergies  Allergen Reactions   Flexeril [Cyclobenzaprine]    Novocain [Procaine]     Current Outpatient Medications   Medication Sig Dispense Refill   aspirin EC 81 MG tablet Take 81 mg by mouth daily. Swallow whole.     atorvastatin (LIPITOR) 10 MG tablet Take 10 mg by mouth daily.     calcium carbonate (OS-CAL - DOSED IN MG OF ELEMENTAL CALCIUM) 1250 (500 Ca) MG tablet Take 1 tablet by mouth daily.     cyanocobalamin (VITAMIN B12) 1000 MCG tablet Take 1,000 mcg by mouth daily.     Multiple Vitamins-Minerals (PRESERVISION AREDS 2 PO) Take 1 capsule by mouth in the morning and at bedtime.     sertraline (ZOLOFT) 50 MG tablet Take 1 tablet by mouth daily.     No current facility-administered medications for this visit.      REVIEW OF SYSTEMS (Negative unless checked)  Constitutional: []$ Weight loss  []$ Fever  []$ Chills Cardiac: []$ Chest pain   []$ Chest pressure   []$ Palpitations   []$ Shortness of breath when laying flat   []$ Shortness of breath at rest   []$ Shortness of breath with exertion. Vascular:  []$ Pain in legs with walking   []$ Pain in legs at rest   []$ Pain in legs when laying flat   []$ Claudication   []$ Pain in feet when walking  []$ Pain in feet at rest  []$ Pain in feet when laying flat   []$ History of DVT   []$ Phlebitis   []$   Swelling in legs   []$ Varicose veins   []$ Non-healing ulcers Pulmonary:   []$ Uses home oxygen   []$ Productive cough   []$ Hemoptysis   []$ Wheeze  []$ COPD   []$ Asthma Neurologic:  [x]$ Dizziness  [x]$ Blackouts   []$ Seizures   []$ History of stroke   []$ History of TIA  []$ Aphasia   []$ Temporary blindness   []$ Dysphagia   []$ Weakness or numbness in arms   []$ Weakness or numbness in legs Musculoskeletal:  [x]$ Arthritis   []$ Joint swelling   []$ Joint pain   []$ Low back pain Hematologic:  []$ Easy bruising  []$ Easy bleeding   []$ Hypercoagulable state   []$ Anemic  []$ Hepatitis Gastrointestinal:  []$ Blood in stool   []$ Vomiting blood  []$ Gastroesophageal reflux/heartburn   []$ Abdominal pain Genitourinary:  []$ Chronic kidney disease   []$ Difficult urination  []$ Frequent urination  []$ Burning with urination   []$ Hematuria Skin:  []$ Rashes    []$ Ulcers   []$ Wounds Psychological:  []$ History of anxiety   []$  History of major depression.    Physical Exam BP 128/73 (BP Location: Right Arm)   Pulse 73   Resp 16   Wt 157 lb 12.8 oz (71.6 kg)   BMI 22.64 kg/m  Gen:  WD/WN, NAD Head: [place]/AT, No temporalis wasting.  Ear/Nose/Throat: Hearing grossly intact, nares w/o erythema or drainage, oropharynx w/o Erythema/Exudate Eyes: Conjunctiva clear, sclera non-icteric  Neck: trachea midline.  No JVD.  Pulmonary:  Good air movement, respirations not labored, no use of accessory muscles  Cardiac: RRR, no JVD Vascular:  Vessel Right Left  Radial Palpable Palpable                                   Gastrointestinal:. No masses, surgical incisions, or scars. Musculoskeletal: M/S 5/5 throughout.  Extremities without ischemic changes.  No deformity or atrophy.  No edema. Neurologic: Sensation grossly intact in extremities.  Symmetrical.  Speech is fluent. Motor exam as listed above. Psychiatric: Judgment intact, Mood & affect appropriate for pt's clinical situation. Dermatologic: No rashes or ulcers noted.  No cellulitis or open wounds.    Radiology No results found.  Labs Recent Results (from the past 2160 hour(s))  CBC     Status: None   Collection Time: 09/17/22  3:00 PM  Result Value Ref Range   WBC 9.9 4.0 - 10.5 K/uL   RBC 4.69 4.22 - 5.81 MIL/uL   Hemoglobin 14.4 13.0 - 17.0 g/dL   HCT 42.7 39.0 - 52.0 %   MCV 91.0 80.0 - 100.0 fL   MCH 30.7 26.0 - 34.0 pg   MCHC 33.7 30.0 - 36.0 g/dL   RDW 12.0 11.5 - 15.5 %   Platelets 246 150 - 400 K/uL   nRBC 0.0 0.0 - 0.2 %    Comment: Performed at [facility], [address] [place], [place] [address]  Comprehensive metabolic panel     Status: Abnormal   Collection Time: 09/17/22  3:00 PM  Result Value Ref Range   Sodium 138 135 - 145 mmol/L   Potassium 4.8 3.5 - 5.1 mmol/L    Comment: HEMOLYSIS AT THIS LEVEL MAY AFFECT RESULT   Chloride 103 98 - 111 mmol/L    CO2 27 22 - 32 mmol/L   Glucose, Bld 119 (H) 70 - 99 mg/dL    Comment: Glucose reference range applies only to samples taken after fasting for at least 8 hours.   BUN 9 8 - 23 mg/dL   Creatinine, Ser 0.75 0.61 -  1.24 mg/dL   Calcium 9.3 8.9 - 10.3 mg/dL   Total Protein 6.9 6.5 - 8.1 g/dL   Albumin 3.9 3.5 - 5.0 g/dL   AST 31 15 - 41 U/L    Comment: HEMOLYSIS AT THIS LEVEL MAY AFFECT RESULT   ALT 20 0 - 44 U/L    Comment: HEMOLYSIS AT THIS LEVEL MAY AFFECT RESULT   Alkaline Phosphatase 64 38 - 126 U/L   Total Bilirubin 1.4 (H) 0.3 - 1.2 mg/dL    Comment: HEMOLYSIS AT THIS LEVEL MAY AFFECT RESULT   GFR, Estimated >60 >60 mL/min    Comment: (NOTE) Calculated using the CKD-EPI Creatinine Equation (2021)    Anion gap 8 5 - 15    Comment: Performed at [facility], [address] [place], [place] [address]  Troponin I (High Sensitivity)     Status: None   Collection Time: 09/17/22  3:00 PM  Result Value Ref Range   Troponin I (High Sensitivity) 4 <18 ng/L    Comment: (NOTE) Elevated high sensitivity troponin I (hsTnI) values and significant  changes across serial measurements may suggest ACS but many other  chronic and acute conditions are known to elevate hsTnI results.  Refer to the "Links" section for chest pain algorithms and additional  guidance. Performed at [facility], [address] [place], [place] [address]   Urinalysis, Routine w reflex microscopic Urine, Clean Catch     Status: Abnormal   Collection Time: 09/17/22  5:16 PM  Result Value Ref Range   Color, Urine STRAW (A) YELLOW   APPearance CLEAR (A) CLEAR   Specific Gravity, Urine 1.010 1.005 - 1.030   pH 7.0 5.0 - 8.0   Glucose, UA NEGATIVE NEGATIVE mg/dL   Hgb urine dipstick NEGATIVE NEGATIVE   Bilirubin Urine NEGATIVE NEGATIVE   Ketones, ur 5 (A) NEGATIVE mg/dL   Protein, ur NEGATIVE NEGATIVE mg/dL   Nitrite NEGATIVE NEGATIVE   Leukocytes,Ua NEGATIVE NEGATIVE    Comment: Performed at  [facility], [address] [place], [place] [id number]  Salicylate level     Status: Abnormal   Collection Time: 09/17/22  5:19 PM  Result Value Ref Range   Salicylate Lvl [id number] (L) 7.0 - 30.0 mg/dL    Comment: Performed at [facility], [place], [place] [address]  Troponin I (High Sensitivity)     Status: None   Collection Time: 09/17/22  5:19 PM  Result Value Ref Range   Troponin I (High Sensitivity) 4 <18 ng/L    Comment: (NOTE) Elevated high sensitivity troponin I (hsTnI) values and significant  changes across serial measurements may suggest ACS but many other  chronic and acute conditions are known to elevate hsTnI results.  Refer to the "Links" section for chest pain algorithms and additional  guidance. Performed at [facility], [address] [place],  [address]   Comprehensive metabolic panel     Status: Abnormal   Collection Time: 09/18/22  1:46 AM  Result Value Ref Range   Sodium 134 (L) 135 - 145 mmol/L   Potassium 3.8 3.5 - 5.1 mmol/L   Chloride 101 98 - 111 mmol/L   CO2 24 22 - 32 mmol/L   Glucose, Bld 138 (H) 70 - 99 mg/dL    Comment: Glucose reference range applies only to samples taken after fasting for at least 8 hours.   BUN 10 8 - 23 mg/dL   Creatinine, Ser 0.80 0.61 - 1.24 mg/dL  Calcium 8.8 (L) 8.9 - 10.3 mg/dL   Total Protein 6.3 (L) 6.5 - 8.1 g/dL   Albumin 3.7 3.5 - 5.0 g/dL   AST 25 15 - 41 U/L   ALT 20 0 - 44 U/L   Alkaline Phosphatase 60 38 - 126 U/L   Total Bilirubin 1.6 (H) 0.3 - 1.2 mg/dL   GFR, Estimated >60 >60 mL/min    Comment: (NOTE) Calculated using the CKD-EPI Creatinine Equation (2021)    Anion gap 9 5 - 15    Comment: Performed at [facility], [address] [place], [place] [address]  CBC with Differential/Platelet     Status: Abnormal   Collection Time: 09/18/22  1:46 AM  Result Value Ref Range   WBC 14.0 (H) 4.0 - 10.5 K/uL   RBC 4.14 (L) 4.22 - 5.81 MIL/uL    Hemoglobin 12.5 (L) 13.0 - 17.0 g/dL   HCT 37.3 (L) 39.0 - 52.0 %   MCV 90.1 80.0 - 100.0 fL   MCH 30.2 26.0 - 34.0 pg   MCHC 33.5 30.0 - 36.0 g/dL   RDW 12.0 11.5 - 15.5 %   Platelets 232 150 - 400 K/uL   nRBC 0.0 0.0 - 0.2 %   Neutrophils Relative % 86 %   Neutro Abs 11.9 (H) 1.7 - 7.7 K/uL   Lymphocytes Relative 8 %   Lymphs Abs 1.1 0.7 - 4.0 K/uL   Monocytes Relative 6 %   Monocytes Absolute 0.9 0.1 - 1.0 K/uL   Eosinophils Relative 0 %   Eosinophils Absolute 0.0 0.0 - 0.5 K/uL   Basophils Relative 0 %   Basophils Absolute 0.0 0.0 - 0.1 K/uL   Immature Granulocytes 0 %   Abs Immature Granulocytes 0.06 0.00 - 0.07 K/uL    Comment: Performed at [facility], [address] [place], [place] [address]  Lipid panel     Status: None   Collection Time: 09/18/22  1:46 AM  Result Value Ref Range   Cholesterol 131 0 - 200 mg/dL   Triglycerides 51 <150 mg/dL   HDL 46 >40 mg/dL   Total CHOL/HDL Ratio 2.8 RATIO   VLDL 10 0 - 40 mg/dL   LDL Cholesterol 75 0 - 99 mg/dL    Comment:        Total Cholesterol/HDL:CHD Risk Coronary Heart Disease Risk Table                     Men   Women  1/2 Average Risk   3.4   3.3  Average Risk       5.0   4.4  2 X Average Risk   9.6   7.1  3 X Average Risk  23.4   11.0        Use the calculated Patient Ratio above and the CHD Risk Table to determine the patient's CHD Risk.        ATP III CLASSIFICATION (LDL):  <100     mg/dL   Optimal  100-129  mg/dL   Near or Above                    Optimal  130-159  mg/dL   Borderline  160-189  mg/dL   High  >190     mg/dL   Very High Performed at [facility], [address] [place] [place], [place] [address]   ECHOCARDIOGRAM COMPLETE     Status: None   Collection Time: 09/18/22  6:26 PM  Result Value Ref Range   Weight 2,398.6 oz   Height 70 in   BP 136/72 mmHg   Ao pk vel 2.80 m/s   AV Area VTI 1.57 cm2   AR max vel 1.46 cm2   AV Mean grad 18.7 mmHg   AV Peak grad 31.4 mmHg   S'  Lateral 3.00 cm   AV Area mean vel 1.31 cm2   Area-P 1/2 3.21 cm2   Est EF 60 - 65%   CBC     Status: Abnormal   Collection Time: 09/19/22  4:11 AM  Result Value Ref Range   WBC 13.2 (H) 4.0 - 10.5 K/uL   RBC 3.75 (L) 4.22 - 5.81 MIL/uL   Hemoglobin 11.2 (L) 13.0 - 17.0 g/dL   HCT 33.2 (L) 39.0 - 52.0 %   MCV 88.5 80.0 - 100.0 fL   MCH 29.9 26.0 - 34.0 pg   MCHC 33.7 30.0 - 36.0 g/dL   RDW 12.0 11.5 - 15.5 %   Platelets 216 150 - 400 K/uL   nRBC 0.0 0.0 - 0.2 %    Comment: Performed at [facility], [address] [place], [place] [id number]  Basic metabolic panel     Status: Abnormal   Collection Time: 09/19/22  4:11 AM  Result Value Ref Range   Sodium 133 (L) 135 - 145 mmol/L   Potassium 4.0 3.5 - 5.1 mmol/L   Chloride 99 98 - 111 mmol/L   CO2 26 22 - 32 mmol/L   Glucose, Bld 140 (H) 70 - 99 mg/dL    Comment: Glucose reference range applies only to samples taken after fasting for at least 8 hours.   BUN 12 8 - 23 mg/dL   Creatinine, Ser 0.67 0.61 - 1.24 mg/dL   Calcium 8.8 (L) 8.9 - 10.3 mg/dL   GFR, Estimated >60 >60 mL/min    Comment: (NOTE) Calculated using the CKD-EPI Creatinine Equation (2021)    Anion gap 8 5 - 15    Comment: Performed at [facility], [address] [place], [place] [address]  Glucose, capillary     Status: Abnormal   Collection Time: 09/19/22  7:37 AM  Result Value Ref Range   Glucose-Capillary 121 (H) 70 - 99 mg/dL    Comment: Glucose reference range applies only to samples taken after fasting for at least 8 hours.  Vitamin B12     Status: None   Collection Time: 09/20/22  4:10 AM  Result Value Ref Range   Vitamin B-12 642 180 - 914 pg/mL    Comment: (NOTE) This assay is not validated for testing neonatal or myeloproliferative syndrome specimens for Vitamin B12 levels. Performed at [facility], [address], [place], [place] [address]   VITAMIN D 25 Hydroxy (Vit-D Deficiency, Fractures)     Status: None    Collection Time: 09/20/22  4:10 AM  Result Value Ref Range   Vit D, 25-Hydroxy See Scanned report in [place] 30 - 100 ng/mL    Comment: Performed at [employer] Performed at [facility], [place] [address] [place], [place] [address]   Phosphorus     Status: None   Collection Time: 09/20/22  4:10 AM  Result Value Ref Range   Phosphorus 3.4 2.5 - 4.6 mg/dL    Comment: Performed at [facility], [address], [place], [place] [address]  Magnesium     Status: None   Collection  Time: 09/20/22  4:10 AM  Result Value Ref Range   Magnesium 2.0 1.7 - 2.4 mg/dL    Comment: Performed at [facility], [address] [place], [place] [address]  Creatinine, serum     Status: None   Collection Time: 09/24/22  4:44 AM  Result Value Ref Range   Creatinine, Ser 0.66 0.61 - 1.24 mg/dL   GFR, Estimated >60 >60 mL/min    Comment: (NOTE) Calculated using the CKD-EPI Creatinine Equation (2021) Performed at [facility], [address] [place], [place] [address]     Assessment/Plan:  Carotid stenosis CT angiogram which I have independently reviewed.  This demonstrated essentially no cervical carotid stenosis his vertebral arteries also appear to have reasonably good flow with the right vertebral artery being clearly larger than the left.  Intracranially, there was significant calcific disease in the carotid arteries bilaterally.  I have showed these images to the patient and his family today.  His carotid disease is minimal in the cervical portion and intracranial disease and only be treated medically.  No role for intervention.  Would recommend continuation of Lipitor and aspirin.  Follow-up in 6 months with duplex.  Stenosis of intracranial vessel CT angiogram which I have independently reviewed.  This demonstrated essentially no cervical carotid stenosis his vertebral arteries also appear to have reasonably good flow with the right vertebral artery  being clearly larger than the left.  Intracranially, there was significant calcific disease in the carotid arteries bilaterally.  I have showed these images to the patient and his family today.  His carotid disease is minimal in the cervical portion and intracranial disease and only be treated medically.  No role for intervention.  Would recommend continuation of Lipitor and aspirin.  Follow-up in 6 months with duplex.  Vertigo Unlikely from his carotid disease and intracranial disease would not be treated interventionally or surgically.  Consider ENT referral.      Leotis Pain 10/23/2022, 4:57 PM   This note was created with Dragon medical transcription system.  Any errors from dictation are unintentional.

## 2022-10-23 NOTE — Assessment & Plan Note (Signed)
CT angiogram which I have independently reviewed.  This demonstrated essentially no cervical carotid stenosis his vertebral arteries also appear to have reasonably good flow with the right vertebral artery being clearly larger than the left.  Intracranially, there was significant calcific disease in the carotid arteries bilaterally.  I have showed these images to the patient and his family today.  His carotid disease is minimal in the cervical portion and intracranial disease and only be treated medically.  No role for intervention.  Would recommend continuation of Lipitor and aspirin.  Follow-up in 6 months with duplex.

## 2022-10-24 DIAGNOSIS — F419 Anxiety disorder, unspecified: Secondary | ICD-10-CM | POA: Diagnosis not present

## 2022-10-24 DIAGNOSIS — I6523 Occlusion and stenosis of bilateral carotid arteries: Secondary | ICD-10-CM | POA: Diagnosis not present

## 2022-10-24 DIAGNOSIS — R42 Dizziness and giddiness: Secondary | ICD-10-CM | POA: Diagnosis not present

## 2022-10-24 DIAGNOSIS — E43 Unspecified severe protein-calorie malnutrition: Secondary | ICD-10-CM | POA: Diagnosis not present

## 2022-10-24 DIAGNOSIS — I2510 Atherosclerotic heart disease of native coronary artery without angina pectoris: Secondary | ICD-10-CM | POA: Diagnosis not present

## 2022-10-24 DIAGNOSIS — H9193 Unspecified hearing loss, bilateral: Secondary | ICD-10-CM | POA: Diagnosis not present

## 2022-10-24 DIAGNOSIS — R55 Syncope and collapse: Secondary | ICD-10-CM | POA: Diagnosis not present

## 2022-10-24 DIAGNOSIS — E119 Type 2 diabetes mellitus without complications: Secondary | ICD-10-CM | POA: Diagnosis not present

## 2022-10-24 DIAGNOSIS — E785 Hyperlipidemia, unspecified: Secondary | ICD-10-CM | POA: Diagnosis not present

## 2022-10-26 DIAGNOSIS — E119 Type 2 diabetes mellitus without complications: Secondary | ICD-10-CM | POA: Diagnosis not present

## 2022-10-26 DIAGNOSIS — H9193 Unspecified hearing loss, bilateral: Secondary | ICD-10-CM | POA: Diagnosis not present

## 2022-10-26 DIAGNOSIS — E785 Hyperlipidemia, unspecified: Secondary | ICD-10-CM | POA: Diagnosis not present

## 2022-10-26 DIAGNOSIS — R55 Syncope and collapse: Secondary | ICD-10-CM | POA: Diagnosis not present

## 2022-10-26 DIAGNOSIS — R42 Dizziness and giddiness: Secondary | ICD-10-CM | POA: Diagnosis not present

## 2022-10-26 DIAGNOSIS — I2510 Atherosclerotic heart disease of native coronary artery without angina pectoris: Secondary | ICD-10-CM | POA: Diagnosis not present

## 2022-10-26 DIAGNOSIS — I6523 Occlusion and stenosis of bilateral carotid arteries: Secondary | ICD-10-CM | POA: Diagnosis not present

## 2022-10-26 DIAGNOSIS — F419 Anxiety disorder, unspecified: Secondary | ICD-10-CM | POA: Diagnosis not present

## 2022-10-26 DIAGNOSIS — E43 Unspecified severe protein-calorie malnutrition: Secondary | ICD-10-CM | POA: Diagnosis not present

## 2022-10-29 DIAGNOSIS — E785 Hyperlipidemia, unspecified: Secondary | ICD-10-CM | POA: Diagnosis not present

## 2022-10-29 DIAGNOSIS — E119 Type 2 diabetes mellitus without complications: Secondary | ICD-10-CM | POA: Diagnosis not present

## 2022-10-29 DIAGNOSIS — R42 Dizziness and giddiness: Secondary | ICD-10-CM | POA: Diagnosis not present

## 2022-10-29 DIAGNOSIS — I2510 Atherosclerotic heart disease of native coronary artery without angina pectoris: Secondary | ICD-10-CM | POA: Diagnosis not present

## 2022-10-29 DIAGNOSIS — E43 Unspecified severe protein-calorie malnutrition: Secondary | ICD-10-CM | POA: Diagnosis not present

## 2022-10-29 DIAGNOSIS — I6523 Occlusion and stenosis of bilateral carotid arteries: Secondary | ICD-10-CM | POA: Diagnosis not present

## 2022-10-29 DIAGNOSIS — F419 Anxiety disorder, unspecified: Secondary | ICD-10-CM | POA: Diagnosis not present

## 2022-10-29 DIAGNOSIS — H9193 Unspecified hearing loss, bilateral: Secondary | ICD-10-CM | POA: Diagnosis not present

## 2022-10-29 DIAGNOSIS — R55 Syncope and collapse: Secondary | ICD-10-CM | POA: Diagnosis not present

## 2022-10-30 DIAGNOSIS — E785 Hyperlipidemia, unspecified: Secondary | ICD-10-CM | POA: Diagnosis not present

## 2022-10-30 DIAGNOSIS — I2510 Atherosclerotic heart disease of native coronary artery without angina pectoris: Secondary | ICD-10-CM | POA: Diagnosis not present

## 2022-10-30 DIAGNOSIS — H9193 Unspecified hearing loss, bilateral: Secondary | ICD-10-CM | POA: Diagnosis not present

## 2022-10-30 DIAGNOSIS — F419 Anxiety disorder, unspecified: Secondary | ICD-10-CM | POA: Diagnosis not present

## 2022-10-30 DIAGNOSIS — R42 Dizziness and giddiness: Secondary | ICD-10-CM | POA: Diagnosis not present

## 2022-10-30 DIAGNOSIS — R55 Syncope and collapse: Secondary | ICD-10-CM | POA: Diagnosis not present

## 2022-10-30 DIAGNOSIS — E119 Type 2 diabetes mellitus without complications: Secondary | ICD-10-CM | POA: Diagnosis not present

## 2022-10-30 DIAGNOSIS — I6523 Occlusion and stenosis of bilateral carotid arteries: Secondary | ICD-10-CM | POA: Diagnosis not present

## 2022-10-30 DIAGNOSIS — E43 Unspecified severe protein-calorie malnutrition: Secondary | ICD-10-CM | POA: Diagnosis not present

## 2022-11-06 DIAGNOSIS — F419 Anxiety disorder, unspecified: Secondary | ICD-10-CM | POA: Diagnosis not present

## 2022-11-06 DIAGNOSIS — E119 Type 2 diabetes mellitus without complications: Secondary | ICD-10-CM | POA: Diagnosis not present

## 2022-11-06 DIAGNOSIS — I2510 Atherosclerotic heart disease of native coronary artery without angina pectoris: Secondary | ICD-10-CM | POA: Diagnosis not present

## 2022-11-06 DIAGNOSIS — R42 Dizziness and giddiness: Secondary | ICD-10-CM | POA: Diagnosis not present

## 2022-11-06 DIAGNOSIS — I6523 Occlusion and stenosis of bilateral carotid arteries: Secondary | ICD-10-CM | POA: Diagnosis not present

## 2022-11-06 DIAGNOSIS — H9193 Unspecified hearing loss, bilateral: Secondary | ICD-10-CM | POA: Diagnosis not present

## 2022-11-06 DIAGNOSIS — E43 Unspecified severe protein-calorie malnutrition: Secondary | ICD-10-CM | POA: Diagnosis not present

## 2022-11-06 DIAGNOSIS — R55 Syncope and collapse: Secondary | ICD-10-CM | POA: Diagnosis not present

## 2022-11-06 DIAGNOSIS — E785 Hyperlipidemia, unspecified: Secondary | ICD-10-CM | POA: Diagnosis not present

## 2022-11-08 DIAGNOSIS — H9193 Unspecified hearing loss, bilateral: Secondary | ICD-10-CM | POA: Diagnosis not present

## 2022-11-08 DIAGNOSIS — I6523 Occlusion and stenosis of bilateral carotid arteries: Secondary | ICD-10-CM | POA: Diagnosis not present

## 2022-11-08 DIAGNOSIS — I2510 Atherosclerotic heart disease of native coronary artery without angina pectoris: Secondary | ICD-10-CM | POA: Diagnosis not present

## 2022-11-08 DIAGNOSIS — R55 Syncope and collapse: Secondary | ICD-10-CM | POA: Diagnosis not present

## 2022-11-08 DIAGNOSIS — E43 Unspecified severe protein-calorie malnutrition: Secondary | ICD-10-CM | POA: Diagnosis not present

## 2022-11-08 DIAGNOSIS — F419 Anxiety disorder, unspecified: Secondary | ICD-10-CM | POA: Diagnosis not present

## 2022-11-08 DIAGNOSIS — R42 Dizziness and giddiness: Secondary | ICD-10-CM | POA: Diagnosis not present

## 2022-11-08 DIAGNOSIS — E785 Hyperlipidemia, unspecified: Secondary | ICD-10-CM | POA: Diagnosis not present

## 2022-11-08 DIAGNOSIS — E119 Type 2 diabetes mellitus without complications: Secondary | ICD-10-CM | POA: Diagnosis not present

## 2022-11-14 DIAGNOSIS — R55 Syncope and collapse: Secondary | ICD-10-CM | POA: Diagnosis not present

## 2022-11-14 DIAGNOSIS — I6523 Occlusion and stenosis of bilateral carotid arteries: Secondary | ICD-10-CM | POA: Diagnosis not present

## 2022-11-14 DIAGNOSIS — H9193 Unspecified hearing loss, bilateral: Secondary | ICD-10-CM | POA: Diagnosis not present

## 2022-11-14 DIAGNOSIS — E119 Type 2 diabetes mellitus without complications: Secondary | ICD-10-CM | POA: Diagnosis not present

## 2022-11-14 DIAGNOSIS — E785 Hyperlipidemia, unspecified: Secondary | ICD-10-CM | POA: Diagnosis not present

## 2022-11-14 DIAGNOSIS — I2510 Atherosclerotic heart disease of native coronary artery without angina pectoris: Secondary | ICD-10-CM | POA: Diagnosis not present

## 2022-11-14 DIAGNOSIS — E43 Unspecified severe protein-calorie malnutrition: Secondary | ICD-10-CM | POA: Diagnosis not present

## 2022-11-14 DIAGNOSIS — R42 Dizziness and giddiness: Secondary | ICD-10-CM | POA: Diagnosis not present

## 2022-11-14 DIAGNOSIS — F419 Anxiety disorder, unspecified: Secondary | ICD-10-CM | POA: Diagnosis not present

## 2022-11-21 DIAGNOSIS — Z0000 Encounter for general adult medical examination without abnormal findings: Secondary | ICD-10-CM | POA: Diagnosis not present

## 2022-11-21 DIAGNOSIS — F32A Depression, unspecified: Secondary | ICD-10-CM | POA: Diagnosis not present

## 2022-11-21 DIAGNOSIS — E785 Hyperlipidemia, unspecified: Secondary | ICD-10-CM | POA: Diagnosis not present

## 2022-11-21 DIAGNOSIS — E538 Deficiency of other specified B group vitamins: Secondary | ICD-10-CM | POA: Diagnosis not present

## 2022-11-21 DIAGNOSIS — I10 Essential (primary) hypertension: Secondary | ICD-10-CM | POA: Diagnosis not present

## 2022-11-21 DIAGNOSIS — R42 Dizziness and giddiness: Secondary | ICD-10-CM | POA: Diagnosis not present

## 2022-11-30 DIAGNOSIS — H8111 Benign paroxysmal vertigo, right ear: Secondary | ICD-10-CM | POA: Diagnosis not present

## 2022-12-17 DIAGNOSIS — H8111 Benign paroxysmal vertigo, right ear: Secondary | ICD-10-CM | POA: Diagnosis not present

## 2022-12-24 DIAGNOSIS — H8111 Benign paroxysmal vertigo, right ear: Secondary | ICD-10-CM | POA: Diagnosis not present

## 2023-01-03 DIAGNOSIS — D485 Neoplasm of uncertain behavior of skin: Secondary | ICD-10-CM | POA: Diagnosis not present

## 2023-01-03 DIAGNOSIS — Z08 Encounter for follow-up examination after completed treatment for malignant neoplasm: Secondary | ICD-10-CM | POA: Diagnosis not present

## 2023-01-03 DIAGNOSIS — D2261 Melanocytic nevi of right upper limb, including shoulder: Secondary | ICD-10-CM | POA: Diagnosis not present

## 2023-01-03 DIAGNOSIS — D2262 Melanocytic nevi of left upper limb, including shoulder: Secondary | ICD-10-CM | POA: Diagnosis not present

## 2023-01-03 DIAGNOSIS — Z85828 Personal history of other malignant neoplasm of skin: Secondary | ICD-10-CM | POA: Diagnosis not present

## 2023-01-03 DIAGNOSIS — D225 Melanocytic nevi of trunk: Secondary | ICD-10-CM | POA: Diagnosis not present

## 2023-01-03 DIAGNOSIS — L821 Other seborrheic keratosis: Secondary | ICD-10-CM | POA: Diagnosis not present

## 2023-01-03 DIAGNOSIS — L570 Actinic keratosis: Secondary | ICD-10-CM | POA: Diagnosis not present

## 2023-01-29 DIAGNOSIS — H8111 Benign paroxysmal vertigo, right ear: Secondary | ICD-10-CM | POA: Diagnosis not present

## 2023-02-01 IMAGING — DX DG SHOULDER 2+V*L*
3 series · 3 of 3 positions shown · non-contrast
Comparison: None

CLINICAL DATA: Mechanical fall today, LEFT shoulder injury

EXAM:
LEFT SHOULDER - 2+ VIEW

[shoulder ap (1 of 2)]
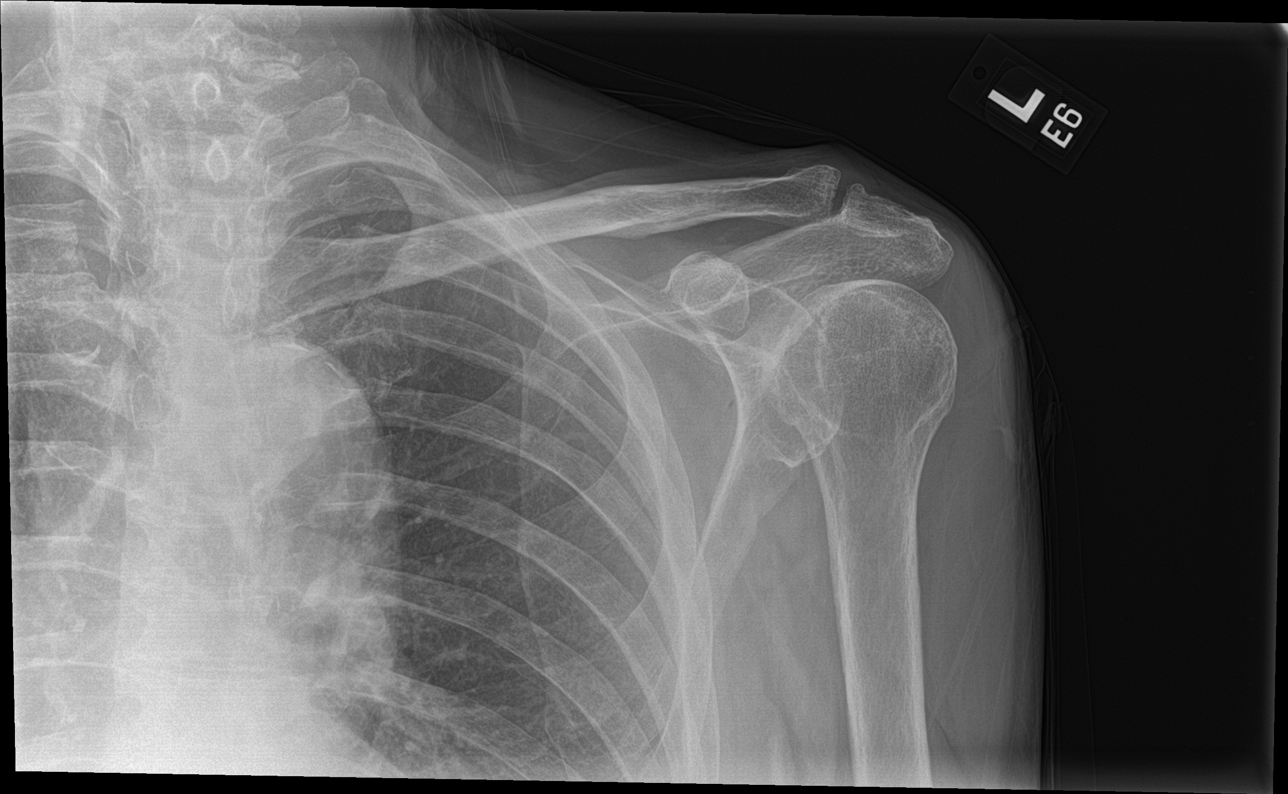

[shoulder ap (2 of 2)]
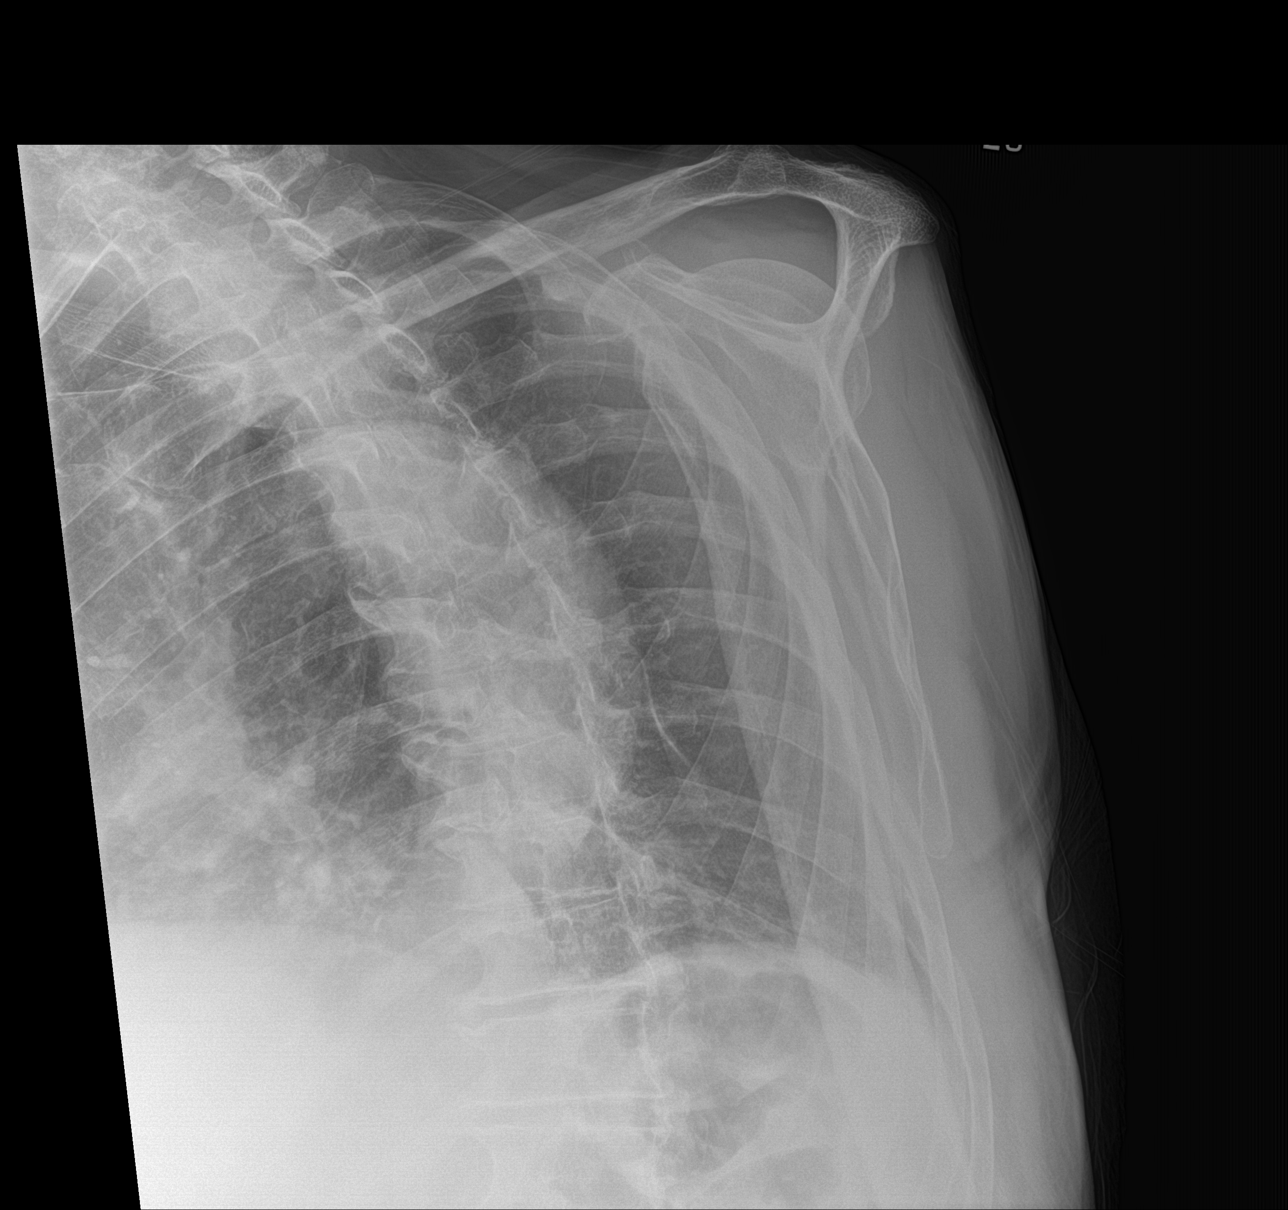

[shoulder obl]
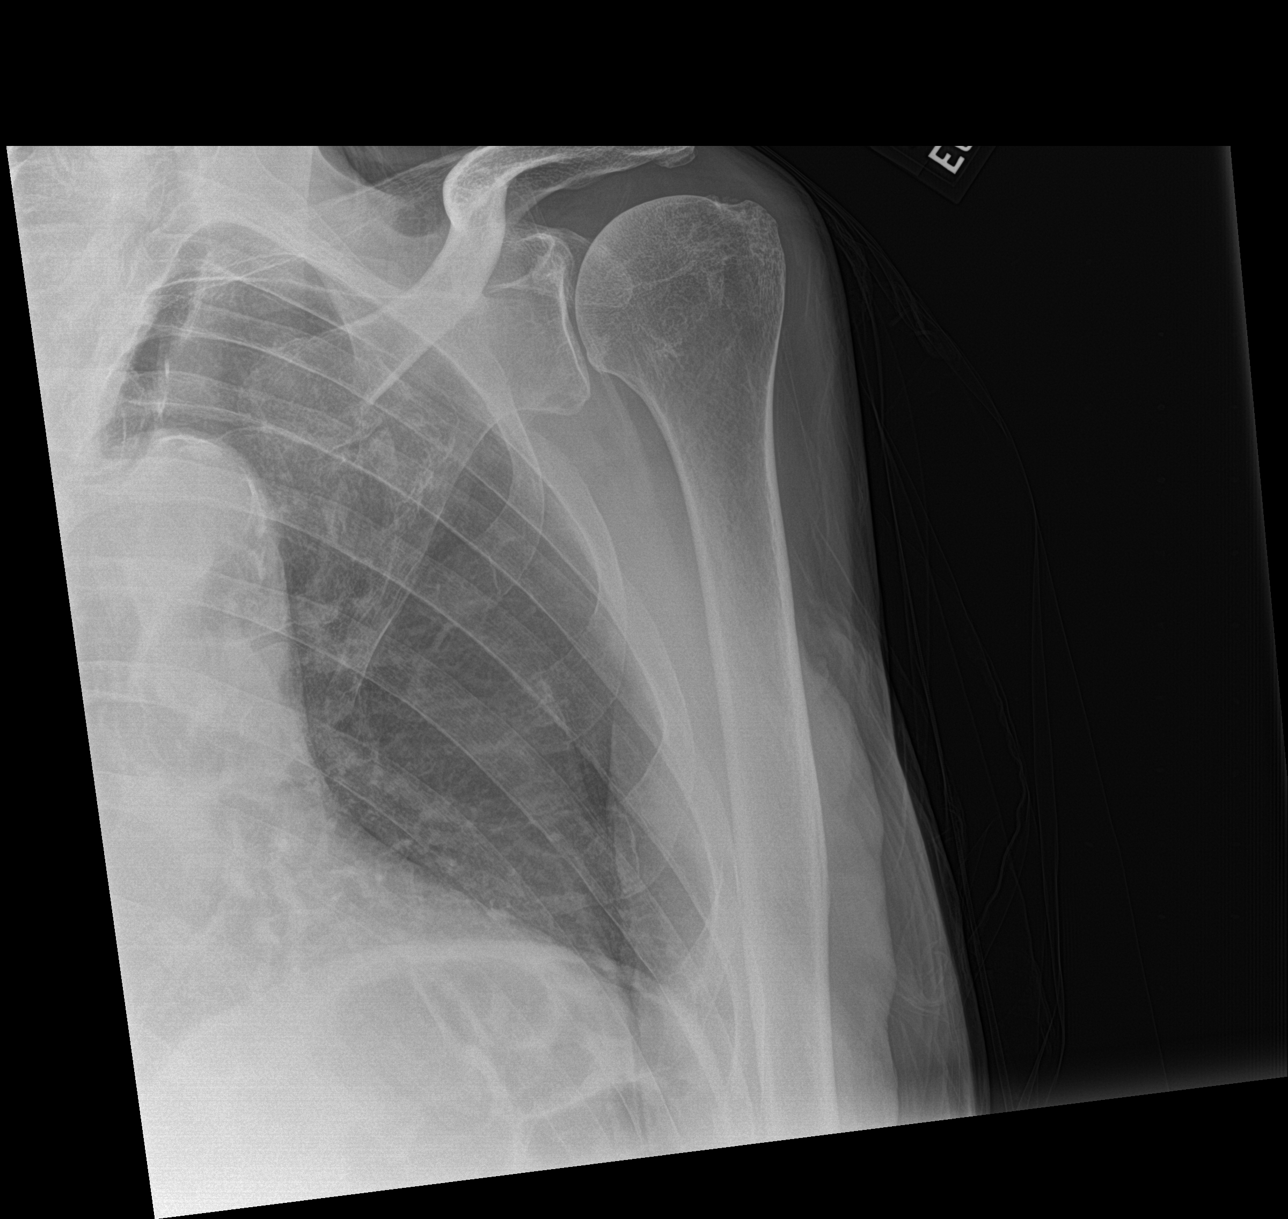

[3 of 3 positions shown; findings below may reference images not displayed]

FINDINGS: Mild osseous demineralization.

AC joint alignment normal.

Visualized ribs intact.

Glenohumeral joint space narrowing.

No acute fracture, dislocation, or bone destruction.

Atherosclerotic calcification aorta.
IMPRESSION: Osseous demineralization with LEFT glenohumeral degenerative
changes.

No acute osseous abnormalities.

Aortic Atherosclerosis ([id number]).

## 2023-02-25 DIAGNOSIS — R030 Elevated blood-pressure reading, without diagnosis of hypertension: Secondary | ICD-10-CM | POA: Diagnosis not present

## 2023-02-25 DIAGNOSIS — R42 Dizziness and giddiness: Secondary | ICD-10-CM | POA: Diagnosis not present

## 2023-02-25 DIAGNOSIS — E538 Deficiency of other specified B group vitamins: Secondary | ICD-10-CM | POA: Diagnosis not present

## 2023-02-25 DIAGNOSIS — Z79899 Other long term (current) drug therapy: Secondary | ICD-10-CM | POA: Diagnosis not present

## 2023-03-21 DIAGNOSIS — Z961 Presence of intraocular lens: Secondary | ICD-10-CM | POA: Diagnosis not present

## 2023-03-21 DIAGNOSIS — H353212 Exudative age-related macular degeneration, right eye, with inactive choroidal neovascularization: Secondary | ICD-10-CM | POA: Diagnosis not present

## 2023-03-21 DIAGNOSIS — H40003 Preglaucoma, unspecified, bilateral: Secondary | ICD-10-CM | POA: Diagnosis not present

## 2023-03-21 DIAGNOSIS — Z0100 Encounter for examination of eyes and vision without abnormal findings: Secondary | ICD-10-CM | POA: Diagnosis not present

## 2023-03-21 DIAGNOSIS — H353122 Nonexudative age-related macular degeneration, left eye, intermediate dry stage: Secondary | ICD-10-CM | POA: Diagnosis not present

## 2023-03-28 DIAGNOSIS — R42 Dizziness and giddiness: Secondary | ICD-10-CM | POA: Diagnosis not present

## 2023-04-23 ENCOUNTER — Ambulatory Visit (INDEPENDENT_AMBULATORY_CARE_PROVIDER_SITE_OTHER): Payer: Medicare HMO

## 2023-04-23 ENCOUNTER — Encounter (INDEPENDENT_AMBULATORY_CARE_PROVIDER_SITE_OTHER): Payer: Self-pay | Admitting: Vascular Surgery

## 2023-04-23 ENCOUNTER — Ambulatory Visit (INDEPENDENT_AMBULATORY_CARE_PROVIDER_SITE_OTHER): Payer: Medicare HMO | Admitting: Vascular Surgery

## 2023-04-23 VITALS — BP 148/68 | HR 60 | Resp 18 | Ht 67.0 in | Wt 155.2 lb

## 2023-04-23 DIAGNOSIS — I6521 Occlusion and stenosis of right carotid artery: Secondary | ICD-10-CM

## 2023-04-23 DIAGNOSIS — I669 Occlusion and stenosis of unspecified cerebral artery: Secondary | ICD-10-CM

## 2023-04-23 NOTE — Assessment & Plan Note (Signed)
Carotid duplex today shows 1 to 39% right ICA stenosis with no significant left carotid stenosis.  Both vertebral arteries were antegrade and there was no subclavian stenosis identified.  No role for intervention and unlikely to be the cause of his symptoms.  Recheck in 1 year.

## 2023-04-23 NOTE — Progress Notes (Signed)
MRN : [id number]  [name] is a 89 y.o. (1930/11/19) male who presents with chief complaint of  Chief Complaint  Patient presents with   Follow-up    fu in 6 months with carotid  .  History of Present Illness: Patient returns today in follow up of his vertigo and carotid disease.  He is still having symptoms of vertigo and dizziness although better than before.  Certain positions do seem to exacerbate this.  He has not had any focal neurologic symptoms of cerebrovascular ischemia. Specifically, the patient denies amaurosis fugax, speech or swallowing difficulties, or arm or leg weakness or numbness.  Carotid duplex today shows 1 to 39% right ICA stenosis with no significant left carotid stenosis.  Both vertebral arteries were antegrade and there was no subclavian stenosis identified.  Current Outpatient Medications  Medication Sig Dispense Refill   atorvastatin (LIPITOR) 10 MG tablet Take 10 mg by mouth daily.     calcium carbonate (OS-CAL - DOSED IN MG OF ELEMENTAL CALCIUM) 1250 (500 Ca) MG tablet Take 1 tablet by mouth daily.     Cholecalciferol (D 1000) 25 MCG (1000 UT) capsule Take 1,000 Units by mouth daily.     cyanocobalamin (VITAMIN B12) 1000 MCG tablet Take 1,000 mcg by mouth daily.     diazepam (VALIUM) 2 MG tablet Take 2 mg by mouth.     Multiple Vitamins-Minerals (PRESERVISION AREDS 2 PO) Take 1 capsule by mouth in the morning and at bedtime.     aspirin EC 81 MG tablet Take 81 mg by mouth daily. Swallow whole. (Patient not taking: Reported on 04/23/2023)     sertraline (ZOLOFT) 50 MG tablet Take 1 tablet by mouth daily. (Patient not taking: Reported on 04/23/2023)     No current facility-administered medications for this visit.    Past Medical History:  Diagnosis Date   Macular degeneration    right eye    Past Surgical History:  Procedure Laterality Date   carpaltunnel Right 01/29/2018     Social History   Tobacco Use   Smoking status: Former    Current  packs/day: 0.00    Types: Cigarettes    Quit date: 02/19/1973    Years since quitting: 50.2  Substance Use Topics   Alcohol use: No   Drug use: Never       Family History  Problem Relation Age of Onset   Cancer Father    Emphysema Maternal Uncle    Diabetes Maternal Grandmother    Diabetes Paternal Grandmother    Dementia Paternal Grandmother      Allergies  Allergen Reactions   Flexeril [Cyclobenzaprine]    Novocain [Procaine]     REVIEW OF SYSTEMS (Negative unless checked)   Constitutional: [] Weight loss  [] Fever  [] Chills Cardiac: [] Chest pain   [] Chest pressure   [] Palpitations   [] Shortness of breath when laying flat   [] Shortness of breath at rest   [] Shortness of breath with exertion. Vascular:  [] Pain in legs with walking   [] Pain in legs at rest   [] Pain in legs when laying flat   [] Claudication   [] Pain in feet when walking  [] Pain in feet at rest  [] Pain in feet when laying flat   [] History of DVT   [] Phlebitis   [] Swelling in legs   [] Varicose veins   [] Non-healing ulcers Pulmonary:   [] Uses home oxygen   [] Productive cough   [] Hemoptysis   [] Wheeze  [] COPD   [] Asthma Neurologic:  [x] Dizziness  [  x]Blackouts   [] Seizures   [] History of stroke   [] History of TIA  [] Aphasia   [] Temporary blindness   [] Dysphagia   [] Weakness or numbness in arms   [] Weakness or numbness in legs Musculoskeletal:  [x] Arthritis   [] Joint swelling   [] Joint pain   [] Low back pain Hematologic:  [] Easy bruising  [] Easy bleeding   [] Hypercoagulable state   [] Anemic  [] Hepatitis Gastrointestinal:  [] Blood in stool   [] Vomiting blood  [] Gastroesophageal reflux/heartburn   [] Abdominal pain Genitourinary:  [] Chronic kidney disease   [] Difficult urination  [] Frequent urination  [] Burning with urination   [] Hematuria Skin:  [] Rashes   [] Ulcers   [] Wounds Psychological:  [] History of anxiety   []  History of major depression.  Physical Examination  BP (!) 148/68 (BP Location: Left Arm)   Pulse 60    Resp 18   Ht 5\' 7"  (1.702 m)   Wt 155 lb 3.2 oz (70.4 kg)   BMI 24.31 kg/m  Gen:  WD/WN, NAD. Appears younger than stated age. Head: [place]/AT, No temporalis wasting. Ear/Nose/Throat: Hearing grossly intact, nares w/o erythema or drainage Eyes: Conjunctiva clear. Sclera non-icteric Neck: Supple.  Trachea midline Pulmonary:  Good air movement, no use of accessory muscles.  Cardiac: RRR, no JVD Vascular:  Vessel Right Left  Radial Palpable Palpable                   Musculoskeletal: M/S 5/5 throughout.  No deformity or atrophy. No edema. Neurologic: Sensation grossly intact in extremities.  Symmetrical.  Speech is fluent.  Psychiatric: Judgment intact, Mood & affect appropriate for pt's clinical situation. Dermatologic: No rashes or ulcers noted.  No cellulitis or open wounds.      Labs No results found for this or any previous visit (from the past 2160 hour(s)).  Radiology No results found.  Assessment/Plan  Carotid stenosis Carotid duplex today shows 1 to 39% right ICA stenosis with no significant left carotid stenosis.  Both vertebral arteries were antegrade and there was no subclavian stenosis identified.  No role for intervention and unlikely to be the cause of his symptoms.  Recheck in 1 year.  Stenosis of intracranial vessel No role for intervention for this.    Festus Barren, MD  04/23/2023 2:47 PM    This note was created with Dragon medical transcription system.  Any errors from dictation are purely unintentional

## 2023-04-23 NOTE — Assessment & Plan Note (Signed)
No role for intervention for this.

## 2023-05-20 DIAGNOSIS — L298 Other pruritus: Secondary | ICD-10-CM | POA: Diagnosis not present

## 2023-05-20 DIAGNOSIS — L538 Other specified erythematous conditions: Secondary | ICD-10-CM | POA: Diagnosis not present

## 2023-05-20 DIAGNOSIS — L820 Inflamed seborrheic keratosis: Secondary | ICD-10-CM | POA: Diagnosis not present

## 2023-05-20 DIAGNOSIS — D225 Melanocytic nevi of trunk: Secondary | ICD-10-CM | POA: Diagnosis not present

## 2023-05-20 DIAGNOSIS — Z85828 Personal history of other malignant neoplasm of skin: Secondary | ICD-10-CM | POA: Diagnosis not present

## 2023-05-20 DIAGNOSIS — D2272 Melanocytic nevi of left lower limb, including hip: Secondary | ICD-10-CM | POA: Diagnosis not present

## 2023-05-20 DIAGNOSIS — D2262 Melanocytic nevi of left upper limb, including shoulder: Secondary | ICD-10-CM | POA: Diagnosis not present

## 2023-05-20 DIAGNOSIS — D2261 Melanocytic nevi of right upper limb, including shoulder: Secondary | ICD-10-CM | POA: Diagnosis not present

## 2023-05-20 DIAGNOSIS — D2271 Melanocytic nevi of right lower limb, including hip: Secondary | ICD-10-CM | POA: Diagnosis not present

## 2023-06-12 DIAGNOSIS — S51811A Laceration without foreign body of right forearm, initial encounter: Secondary | ICD-10-CM | POA: Diagnosis not present

## 2023-06-12 DIAGNOSIS — S4991XA Unspecified injury of right shoulder and upper arm, initial encounter: Secondary | ICD-10-CM | POA: Diagnosis not present

## 2023-06-12 DIAGNOSIS — M8580 Other specified disorders of bone density and structure, unspecified site: Secondary | ICD-10-CM | POA: Diagnosis not present

## 2023-06-13 DIAGNOSIS — E7800 Pure hypercholesterolemia, unspecified: Secondary | ICD-10-CM | POA: Diagnosis not present

## 2023-06-13 DIAGNOSIS — R42 Dizziness and giddiness: Secondary | ICD-10-CM | POA: Diagnosis not present

## 2023-06-13 DIAGNOSIS — E538 Deficiency of other specified B group vitamins: Secondary | ICD-10-CM | POA: Diagnosis not present

## 2023-06-13 DIAGNOSIS — F4323 Adjustment disorder with mixed anxiety and depressed mood: Secondary | ICD-10-CM | POA: Diagnosis not present

## 2023-06-13 DIAGNOSIS — I10 Essential (primary) hypertension: Secondary | ICD-10-CM | POA: Diagnosis not present

## 2023-06-13 DIAGNOSIS — Z79899 Other long term (current) drug therapy: Secondary | ICD-10-CM | POA: Diagnosis not present

## 2023-08-14 DIAGNOSIS — R42 Dizziness and giddiness: Secondary | ICD-10-CM | POA: Diagnosis not present

## 2023-08-14 DIAGNOSIS — F4323 Adjustment disorder with mixed anxiety and depressed mood: Secondary | ICD-10-CM | POA: Diagnosis not present

## 2023-08-14 DIAGNOSIS — R030 Elevated blood-pressure reading, without diagnosis of hypertension: Secondary | ICD-10-CM | POA: Diagnosis not present

## 2023-08-14 DIAGNOSIS — E7800 Pure hypercholesterolemia, unspecified: Secondary | ICD-10-CM | POA: Diagnosis not present

## 2023-08-19 DIAGNOSIS — D485 Neoplasm of uncertain behavior of skin: Secondary | ICD-10-CM | POA: Diagnosis not present

## 2023-08-19 DIAGNOSIS — L570 Actinic keratosis: Secondary | ICD-10-CM | POA: Diagnosis not present

## 2023-08-19 DIAGNOSIS — L858 Other specified epidermal thickening: Secondary | ICD-10-CM | POA: Diagnosis not present

## 2023-08-27 DIAGNOSIS — C44622 Squamous cell carcinoma of skin of right upper limb, including shoulder: Secondary | ICD-10-CM | POA: Diagnosis not present

## 2023-09-13 DIAGNOSIS — L989 Disorder of the skin and subcutaneous tissue, unspecified: Secondary | ICD-10-CM | POA: Diagnosis not present

## 2023-09-24 DIAGNOSIS — H40003 Preglaucoma, unspecified, bilateral: Secondary | ICD-10-CM | POA: Diagnosis not present

## 2023-09-24 DIAGNOSIS — H353122 Nonexudative age-related macular degeneration, left eye, intermediate dry stage: Secondary | ICD-10-CM | POA: Diagnosis not present

## 2023-09-24 DIAGNOSIS — H353212 Exudative age-related macular degeneration, right eye, with inactive choroidal neovascularization: Secondary | ICD-10-CM | POA: Diagnosis not present

## 2023-09-24 DIAGNOSIS — H43813 Vitreous degeneration, bilateral: Secondary | ICD-10-CM | POA: Diagnosis not present

## 2023-12-22 DIAGNOSIS — S59901A Unspecified injury of right elbow, initial encounter: Secondary | ICD-10-CM | POA: Diagnosis not present

## 2024-01-07 DIAGNOSIS — E7800 Pure hypercholesterolemia, unspecified: Secondary | ICD-10-CM | POA: Diagnosis not present

## 2024-01-07 DIAGNOSIS — Z125 Encounter for screening for malignant neoplasm of prostate: Secondary | ICD-10-CM | POA: Diagnosis not present

## 2024-01-07 DIAGNOSIS — Z0000 Encounter for general adult medical examination without abnormal findings: Secondary | ICD-10-CM | POA: Diagnosis not present

## 2024-01-07 DIAGNOSIS — R030 Elevated blood-pressure reading, without diagnosis of hypertension: Secondary | ICD-10-CM | POA: Diagnosis not present

## 2024-01-07 DIAGNOSIS — Z79899 Other long term (current) drug therapy: Secondary | ICD-10-CM | POA: Diagnosis not present

## 2024-01-07 DIAGNOSIS — F4323 Adjustment disorder with mixed anxiety and depressed mood: Secondary | ICD-10-CM | POA: Diagnosis not present

## 2024-01-07 DIAGNOSIS — E538 Deficiency of other specified B group vitamins: Secondary | ICD-10-CM | POA: Diagnosis not present

## 2024-03-02 DIAGNOSIS — L570 Actinic keratosis: Secondary | ICD-10-CM | POA: Diagnosis not present

## 2024-03-02 DIAGNOSIS — D225 Melanocytic nevi of trunk: Secondary | ICD-10-CM | POA: Diagnosis not present

## 2024-03-02 DIAGNOSIS — D2272 Melanocytic nevi of left lower limb, including hip: Secondary | ICD-10-CM | POA: Diagnosis not present

## 2024-03-02 DIAGNOSIS — D2262 Melanocytic nevi of left upper limb, including shoulder: Secondary | ICD-10-CM | POA: Diagnosis not present

## 2024-03-02 DIAGNOSIS — D0439 Carcinoma in situ of skin of other parts of face: Secondary | ICD-10-CM | POA: Diagnosis not present

## 2024-03-02 DIAGNOSIS — D044 Carcinoma in situ of skin of scalp and neck: Secondary | ICD-10-CM | POA: Diagnosis not present

## 2024-03-02 DIAGNOSIS — D2261 Melanocytic nevi of right upper limb, including shoulder: Secondary | ICD-10-CM | POA: Diagnosis not present

## 2024-03-02 DIAGNOSIS — D485 Neoplasm of uncertain behavior of skin: Secondary | ICD-10-CM | POA: Diagnosis not present

## 2024-03-02 DIAGNOSIS — Z85828 Personal history of other malignant neoplasm of skin: Secondary | ICD-10-CM | POA: Diagnosis not present

## 2024-03-11 DIAGNOSIS — D0439 Carcinoma in situ of skin of other parts of face: Secondary | ICD-10-CM | POA: Diagnosis not present

## 2024-03-18 DIAGNOSIS — L570 Actinic keratosis: Secondary | ICD-10-CM | POA: Diagnosis not present

## 2024-03-18 DIAGNOSIS — D0439 Carcinoma in situ of skin of other parts of face: Secondary | ICD-10-CM | POA: Diagnosis not present

## 2024-03-31 DIAGNOSIS — H43813 Vitreous degeneration, bilateral: Secondary | ICD-10-CM | POA: Diagnosis not present

## 2024-03-31 DIAGNOSIS — H353212 Exudative age-related macular degeneration, right eye, with inactive choroidal neovascularization: Secondary | ICD-10-CM | POA: Diagnosis not present

## 2024-03-31 DIAGNOSIS — H353122 Nonexudative age-related macular degeneration, left eye, intermediate dry stage: Secondary | ICD-10-CM | POA: Diagnosis not present

## 2024-03-31 DIAGNOSIS — H40003 Preglaucoma, unspecified, bilateral: Secondary | ICD-10-CM | POA: Diagnosis not present

## 2024-04-03 DIAGNOSIS — T162XXA Foreign body in left ear, initial encounter: Secondary | ICD-10-CM | POA: Diagnosis not present

## 2024-04-03 DIAGNOSIS — H8111 Benign paroxysmal vertigo, right ear: Secondary | ICD-10-CM | POA: Diagnosis not present

## 2024-04-20 DIAGNOSIS — E538 Deficiency of other specified B group vitamins: Secondary | ICD-10-CM | POA: Diagnosis not present

## 2024-04-20 DIAGNOSIS — R252 Cramp and spasm: Secondary | ICD-10-CM | POA: Diagnosis not present

## 2024-04-20 DIAGNOSIS — Z79899 Other long term (current) drug therapy: Secondary | ICD-10-CM | POA: Diagnosis not present

## 2024-04-20 DIAGNOSIS — F4323 Adjustment disorder with mixed anxiety and depressed mood: Secondary | ICD-10-CM | POA: Diagnosis not present

## 2024-04-20 DIAGNOSIS — R030 Elevated blood-pressure reading, without diagnosis of hypertension: Secondary | ICD-10-CM | POA: Diagnosis not present

## 2024-04-20 DIAGNOSIS — E7800 Pure hypercholesterolemia, unspecified: Secondary | ICD-10-CM | POA: Diagnosis not present

## 2024-04-28 ENCOUNTER — Encounter (INDEPENDENT_AMBULATORY_CARE_PROVIDER_SITE_OTHER): Payer: Medicare HMO

## 2024-04-28 ENCOUNTER — Ambulatory Visit (INDEPENDENT_AMBULATORY_CARE_PROVIDER_SITE_OTHER): Payer: Medicare HMO | Admitting: Vascular Surgery

## 2024-07-14 DIAGNOSIS — H353122 Nonexudative age-related macular degeneration, left eye, intermediate dry stage: Secondary | ICD-10-CM | POA: Diagnosis not present

## 2024-07-14 DIAGNOSIS — H43813 Vitreous degeneration, bilateral: Secondary | ICD-10-CM | POA: Diagnosis not present

## 2024-07-14 DIAGNOSIS — H353212 Exudative age-related macular degeneration, right eye, with inactive choroidal neovascularization: Secondary | ICD-10-CM | POA: Diagnosis not present

## 2024-07-14 DIAGNOSIS — H40003 Preglaucoma, unspecified, bilateral: Secondary | ICD-10-CM | POA: Diagnosis not present

## 2024-07-23 DIAGNOSIS — E782 Mixed hyperlipidemia: Secondary | ICD-10-CM | POA: Diagnosis not present

## 2024-07-23 DIAGNOSIS — R030 Elevated blood-pressure reading, without diagnosis of hypertension: Secondary | ICD-10-CM | POA: Diagnosis not present

## 2024-07-23 DIAGNOSIS — F4323 Adjustment disorder with mixed anxiety and depressed mood: Secondary | ICD-10-CM | POA: Diagnosis not present

## 2024-07-23 DIAGNOSIS — Z79899 Other long term (current) drug therapy: Secondary | ICD-10-CM | POA: Diagnosis not present

## 2024-07-23 DIAGNOSIS — R42 Dizziness and giddiness: Secondary | ICD-10-CM | POA: Diagnosis not present

## 2024-07-23 DIAGNOSIS — E538 Deficiency of other specified B group vitamins: Secondary | ICD-10-CM | POA: Diagnosis not present
